# Patient Record
Sex: Male | Born: 1940 | Race: White | Hispanic: No | Smoking: Former smoker
Health system: Southern US, Community
[De-identification: ages and names within clinical notes are randomized; demographics above are authoritative.]

## PROBLEM LIST (undated history)

## (undated) DIAGNOSIS — C679 Malignant neoplasm of bladder, unspecified: Secondary | ICD-10-CM

## (undated) DIAGNOSIS — N189 Chronic kidney disease, unspecified: Secondary | ICD-10-CM

## (undated) DIAGNOSIS — F0390 Unspecified dementia without behavioral disturbance: Secondary | ICD-10-CM

## (undated) DIAGNOSIS — I10 Essential (primary) hypertension: Secondary | ICD-10-CM

## (undated) HISTORY — PX: BACK SURGERY: SHX140

## (undated) HISTORY — PX: CHOLECYSTECTOMY: SHX55

## (undated) HISTORY — PX: MINOR HEMORRHOIDECTOMY: SHX6238

---

## 2015-05-06 ENCOUNTER — Emergency Department (HOSPITAL_COMMUNITY): Payer: Medicare Other

## 2015-05-06 ENCOUNTER — Emergency Department (HOSPITAL_COMMUNITY)
Admission: EM | Admit: 2015-05-06 | Discharge: 2015-05-07 | Disposition: A | Discharge status: Other Acute Inpatient Hospital | Payer: Medicare Other | Attending: Emergency Medicine | Admitting: Emergency Medicine

## 2015-05-06 ENCOUNTER — Encounter (HOSPITAL_COMMUNITY): Payer: Self-pay | Admitting: Emergency Medicine

## 2015-05-06 DIAGNOSIS — Z79899 Other long term (current) drug therapy: Secondary | ICD-10-CM | POA: Diagnosis not present

## 2015-05-06 DIAGNOSIS — F03918 Unspecified dementia, unspecified severity, with other behavioral disturbance: Secondary | ICD-10-CM | POA: Diagnosis present

## 2015-05-06 DIAGNOSIS — Z8551 Personal history of malignant neoplasm of bladder: Secondary | ICD-10-CM | POA: Diagnosis not present

## 2015-05-06 DIAGNOSIS — F0391 Unspecified dementia with behavioral disturbance: Secondary | ICD-10-CM | POA: Diagnosis not present

## 2015-05-06 DIAGNOSIS — Z7951 Long term (current) use of inhaled steroids: Secondary | ICD-10-CM | POA: Diagnosis not present

## 2015-05-06 DIAGNOSIS — Z0000 Encounter for general adult medical examination without abnormal findings: Secondary | ICD-10-CM

## 2015-05-06 DIAGNOSIS — Z008 Encounter for other general examination: Secondary | ICD-10-CM | POA: Diagnosis present

## 2015-05-06 HISTORY — DX: Unspecified dementia, unspecified severity, without behavioral disturbance, psychotic disturbance, mood disturbance, and anxiety: F03.90

## 2015-05-06 HISTORY — DX: Malignant neoplasm of bladder, unspecified: C67.9

## 2015-05-06 LAB — CBC WITH DIFFERENTIAL/PLATELET
Basophils Absolute: 0 10*3/uL (ref 0.0–0.1)
Basophils Relative: 0 %
Eosinophils Absolute: 0.1 10*3/uL (ref 0.0–0.7)
Eosinophils Relative: 2 %
HEMATOCRIT: 40.6 % (ref 39.0–52.0)
HEMOGLOBIN: 13.1 g/dL (ref 13.0–17.0)
LYMPHS PCT: 21 %
Lymphs Abs: 1.2 10*3/uL (ref 0.7–4.0)
MCH: 30.9 pg (ref 26.0–34.0)
MCHC: 32.3 g/dL (ref 30.0–36.0)
MCV: 95.8 fL (ref 78.0–100.0)
MONO ABS: 0.7 10*3/uL (ref 0.1–1.0)
MONOS PCT: 11 %
NEUTROS ABS: 3.7 10*3/uL (ref 1.7–7.7)
NEUTROS PCT: 66 %
Platelets: 211 10*3/uL (ref 150–400)
RBC: 4.24 MIL/uL (ref 4.22–5.81)
RDW: 13.9 % (ref 11.5–15.5)
WBC: 5.7 10*3/uL (ref 4.0–10.5)

## 2015-05-06 LAB — URINALYSIS, ROUTINE W REFLEX MICROSCOPIC
BILIRUBIN URINE: NEGATIVE
GLUCOSE, UA: NEGATIVE mg/dL
Hgb urine dipstick: NEGATIVE
KETONES UR: NEGATIVE mg/dL
LEUKOCYTES UA: NEGATIVE
Nitrite: NEGATIVE
PH: 5.5 (ref 5.0–8.0)
Protein, ur: NEGATIVE mg/dL
SPECIFIC GRAVITY, URINE: 1.015 (ref 1.005–1.030)

## 2015-05-06 LAB — COMPREHENSIVE METABOLIC PANEL
ALBUMIN: 4.1 g/dL (ref 3.5–5.0)
ALK PHOS: 84 U/L (ref 38–126)
ALT: 18 U/L (ref 17–63)
ANION GAP: 8 (ref 5–15)
AST: 23 U/L (ref 15–41)
BUN: 16 mg/dL (ref 6–20)
CHLORIDE: 107 mmol/L (ref 101–111)
CO2: 26 mmol/L (ref 22–32)
Calcium: 9.4 mg/dL (ref 8.9–10.3)
Creatinine, Ser: 1.31 mg/dL — ABNORMAL HIGH (ref 0.61–1.24)
GFR calc Af Amer: >60 mL/min (ref >60)
GFR calc non Af Amer: 52 mL/min — ABNORMAL LOW (ref >60)
GLUCOSE: 121 mg/dL — AB (ref 65–99)
POTASSIUM: 4 mmol/L (ref 3.5–5.1)
SODIUM: 141 mmol/L (ref 135–145)
Total Bilirubin: 0.9 mg/dL (ref 0.3–1.2)
Total Protein: 7.4 g/dL (ref 6.5–8.1)

## 2015-05-06 LAB — ETHANOL: Alcohol, Ethyl (B): <5 mg/dL (ref <5)

## 2015-05-06 MED ORDER — NICOTINE 21 MG/24HR TD PT24: 21.0000 mg | MEDICATED_PATCH | Freq: Every day | TRANSDERMAL | Status: DC | PRN

## 2015-05-06 MED ORDER — ALUM & MAG HYDROXIDE-SIMETH 200-200-20 MG/5ML PO SUSP: 30.0000 mL | ORAL | Status: DC | PRN

## 2015-05-06 MED ORDER — ACETAMINOPHEN 325 MG PO TABS: 650.0000 mg | ORAL_TABLET | ORAL | Status: DC | PRN

## 2015-05-06 MED ORDER — ONDANSETRON HCL 4 MG PO TABS: 4.0000 mg | ORAL_TABLET | Freq: Three times a day (TID) | ORAL | Status: DC | PRN
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
## 2015-05-06 NOTE — ED Notes (Signed)
Male tech will try to draw labs
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
## 2015-05-06 NOTE — ED Notes (Signed)
Pt removed gown and put back on his clothing. Eulis Foster, MD at bedside for evaluation.
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
## 2015-05-07 ENCOUNTER — Emergency Department (HOSPITAL_COMMUNITY): Payer: Medicare Other

## 2015-05-07 ENCOUNTER — Other Ambulatory Visit: Payer: Self-pay

## 2015-05-07 DIAGNOSIS — F0391 Unspecified dementia with behavioral disturbance: Secondary | ICD-10-CM

## 2015-05-07 DIAGNOSIS — F03918 Unspecified dementia, unspecified severity, with other behavioral disturbance: Secondary | ICD-10-CM | POA: Diagnosis present

## 2015-05-07 LAB — RAPID URINE DRUG SCREEN, HOSP PERFORMED
AMPHETAMINES: NOT DETECTED
BARBITURATES: NOT DETECTED
Benzodiazepines: POSITIVE — AB
COCAINE: NOT DETECTED
Opiates: NOT DETECTED
TETRAHYDROCANNABINOL: NOT DETECTED

## 2015-05-07 MED ORDER — CITALOPRAM HYDROBROMIDE 10 MG PO TABS
10.0000 mg | ORAL_TABLET | Freq: Every day | ORAL | Status: DC
  Administered 2015-05-07: 10 mg via ORAL
  Filled 2015-05-07: qty 1

## 2015-05-07 MED ORDER — QUETIAPINE FUMARATE 50 MG PO TABS: 50.0000 mg | ORAL_TABLET | Freq: Every day | ORAL | Status: DC

## 2015-05-07 MED ORDER — DONEPEZIL HCL 5 MG PO TABS: 10.0000 mg | ORAL_TABLET | Freq: Every day | ORAL | Status: DC

## 2015-05-07 MED ORDER — HALOPERIDOL LACTATE 5 MG/ML IJ SOLN
5.0000 mg | Freq: Once | INTRAMUSCULAR | Status: AC
  Administered 2015-05-07: 5 mg via INTRAMUSCULAR
  Filled 2015-05-07: qty 1

## 2015-05-07 MED ORDER — BENAZEPRIL HCL 40 MG PO TABS
40.0000 mg | ORAL_TABLET | Freq: Every morning | ORAL | Status: DC
  Administered 2015-05-07: 40 mg via ORAL
  Filled 2015-05-07: qty 1

## 2015-05-07 MED ORDER — AMLODIPINE BESYLATE 5 MG PO TABS
5.0000 mg | ORAL_TABLET | ORAL | Status: DC
Start: 2015-05-08 — End: 2015-05-07

## 2015-05-07 MED ORDER — AMLODIPINE BESYLATE 5 MG PO TABS
5.0000 mg | ORAL_TABLET | Freq: Every morning | ORAL | Status: DC
Start: 2015-05-07 — End: 2015-05-07
  Administered 2015-05-07: 5 mg via ORAL
  Filled 2015-05-07: qty 1

## 2015-05-07 MED ORDER — FLUTICASONE PROPIONATE 50 MCG/ACT NA SUSP
1.0000 | Freq: Every morning | NASAL | Status: DC
  Administered 2015-05-07: 1 via NASAL
  Filled 2015-05-07: qty 16

## 2015-05-07 MED ORDER — HALOPERIDOL 1 MG PO TABS
1.0000 mg | ORAL_TABLET | Freq: Three times a day (TID) | ORAL | Status: DC
Start: 2015-05-07 — End: 2015-05-07
  Administered 2015-05-07: 1 mg via ORAL
  Filled 2015-05-07: qty 1

## 2015-05-07 MED ORDER — LISINOPRIL 2.5 MG PO TABS: 2.5000 mg | ORAL_TABLET | Freq: Every morning | ORAL | Status: DC

## 2015-05-07 MED ORDER — HALOPERIDOL 1 MG PO TABS: 0.5000 mg | ORAL_TABLET | Freq: Two times a day (BID) | ORAL | Status: DC

## 2015-05-07 MED ORDER — MEMANTINE HCL ER 7 MG PO CP24
7.0000 mg | ORAL_CAPSULE | Freq: Every morning | ORAL | Status: DC
  Administered 2015-05-07: 7 mg via ORAL
  Filled 2015-05-07: qty 1

## 2015-05-07 NOTE — ED Notes (Signed)
Patient belongings - khaki pants, brown belt, brown shoes, long sleeved shirt, silver metal watch.  All belongings labeled and placed in locker #29.

## 2015-05-07 NOTE — BHH Suicide Risk Assessment (Signed)
Suicide Risk Assessment  Discharge Assessment   [facility] Discharge Suicide Risk Assessment   Principal Problem: Dementia with behavioral disturbance Discharge Diagnoses:  Patient Active Problem List   Diagnosis Date Noted  . Dementia with behavioral disturbance [F03.91] 05/07/2015    Total Time spent with patient: 15 minutes  Musculoskeletal: Strength & Muscle Tone: unable to assess; patient evaluated in bed Gait & Station: unable to assess; patient evaluated in bed Patient leans: unable to assess; patient evaluated in bed  Psychiatric Specialty Exam: Blood pressure 150/97, pulse 60, temperature 97.5 F (36.4 C), temperature source Axillary, resp. rate 16, height 6\' 1"  (1.854 m), weight 95.255 kg (210 lb), SpO2 98 %.Body mass index is 27.71 kg/(m^2).  General Appearance: Casual  Eye Contact::  Fair  Speech:  unable to assess due to dementia  Volume:  Decreased  Mood:  unable to assess due to dementia  Affect:  Flat  Thought Process:  unable to assess due to dementia  Orientation:  Other:  unable to assess due to dementia  Thought Content:  unable to assess due to dementia  Suicidal Thoughts:  unable to assess due to dementia  Homicidal Thoughts:  unable to assess due to dementia  Memory:  unable to assess due to dementia  Judgement:  Other:  unable to assess due to dementia  Insight:  unable to assess due to dementia  Psychomotor Activity:  Decreased  Concentration:  unable to assess due to dementia  Recall:  unable to assess due to dementia  Fund of Knowledge:unable to assess due to dementia  Language: unable to assess due to dementia  Akathisia:  No  Handed:  Right  AIMS (if indicated):     Assets:  Housing Physical Health Social Support  ADL's:  Impaired  Cognition: Impaired,  Moderate  Sleep:       Mental Status Per Nursing Assessment::   On Admission:     Demographic Factors:  Male, Age 74 or older and Caucasian  Loss Factors: Decline in physical  health  Historical Factors: NA  Risk Reduction Factors:   Living with another person, especially a relative and Positive social support  Continued Clinical Symptoms:  Medical Diagnoses and Treatments/Surgeries  Cognitive Features That Contribute To Risk:  Loss of executive function    Suicide Risk:  Minimal: No identifiable suicidal ideation.  Patients presenting with no risk factors but with morbid ruminations; may be classified as minimal risk based on the severity of the depressive symptoms    Plan Of Care/Follow-up recommendations:  Activity:  As tolerated Diet:  Regular Tests:  As determined by PCP Other:     Serena Colonel, FNP-BC [place] 05/07/2015, 10:28 PM

## 2015-05-07 NOTE — Progress Notes (Signed)
Disposition CSW received telephone contact from [facility] staff requesting Chest X-Ray, Head CT, and EKG for possible admission.  CSW faxed Head CT to [facility] staff.  CSW contacted ED TTS to inform ED staff of requested testing.  [facility] Disposition CSW [phone]

## 2015-05-07 NOTE — Progress Notes (Signed)
This Probation officer spoke with Ren with [facility] [phone] 3x3 confirmed the request for transportation.  She is unable to provide a ETA only able to put patient on the list for transport.     Chesley Noon, MSW, Darlyn Read [facility] Triage Specialist [phone]

## 2015-05-07 NOTE — Consult Note (Signed)
[facility] Consult   Reason for Consult:  Psychiatric Evaluation Referring Physician:  EDP Patient Identification: Roberto Campbell MRN:  [id number] Principal Diagnosis: Dementia with behavioral disturbance Diagnosis:   Patient Active Problem List   Diagnosis Date Noted  . Dementia with behavioral disturbance [F03.91] 05/07/2015    Total Time spent with patient: 35 minutes  Subjective:   Roberto Campbell is a 75 y.o. male patient from [place].   HPI:  Roberto Campbell is a 75 yo Caucasian male who presented to [facility] ED for evaluation of sexually aggressive behavior per his nursing facility. He was seen recently at another hospital for the same behavior, he was evaluated for a urinary tract infection which was negative. He is seen today by Dr. Darleene Cleaver and Manus Gunning, NP. He is alert. No agitation or aggressive behaviors are observed. When asked a question he responds 'yea' only. When asked the year he responded "lent." His sitter at the bedside states he is cooperative but does grab at people but not overly aggressive at this point.   Past Psychiatric History: Dementia  Risk to Self: Suicidal Ideation: No Suicidal Intent: No Is patient at risk for suicide?: No Suicidal Plan?: No Access to Means: No What has been your use of drugs/alcohol within the last 12 months?: None  How many times?:  (Unable to assess ) Other Self Harm Risks:  (Unable to assess ) Triggers for Past Attempts: None known Intentional Self Injurious Behavior: None Risk to Others: Homicidal Ideation: No Thoughts of Harm to Others: No Current Homicidal Intent: No Current Homicidal Plan: No Access to Homicidal Means: No Identified Victim: N/A History of harm to others?:  (Unknown ) Assessment of Violence: None Noted Violent Behavior Description: No violent behaviors observed.  Does patient have access to weapons?: No Criminal Charges Pending?: No Does patient have a court date:  No Prior Inpatient Therapy: Prior Inpatient Therapy:  (Unable to assess) Prior Outpatient Therapy: Prior Outpatient Therapy: Yes Prior Therapy Dates: Current  Prior Therapy Facilty/Provider(s): [facility]  Reason for Treatment: Dementia  Does patient have an ACCT team?: No Does patient have Intensive In-House Services?  : No Does patient have [facility] services? : No Does patient have P4CC services?: No  Past Medical History:  Past Medical History  Diagnosis Date  . Dementia   . Bladder cancer ([place])    No past surgical history on file. Family History: No family history on file. Family Psychiatric  History: Unknown Social History:  History  Alcohol Use No     History  Drug Use Not on file    Social History   Social History  . Marital Status: Divorced    Spouse Name: N/A  . Number of Children: N/A  . Years of Education: N/A   Social History Main Topics  . Smoking status: None  . Smokeless tobacco: None  . Alcohol Use: No  . Drug Use: None  . Sexual Activity: Not Asked   Other Topics Concern  . None   Social History Narrative  . None   Additional Social History:    History of alcohol / drug use?:  (unable to assess )    Allergies:   Allergies  Allergen Reactions  . Morphine And Related Swelling    Other reaction(s): Hallucinations  . Alprazolam     Other reaction(s): Other (See Comments) Nightmares  . Atorvastatin Other (See Comments)    Myalgias.   . Meperidine     Other reaction(s): Other (  See Comments) Hallucinations    Labs:  Results for orders placed or performed during the hospital encounter of 05/06/15 (from the past 48 hour(s))  Comprehensive metabolic panel     Status: Abnormal   Collection Time: 05/06/15  8:19 PM  Result Value Ref Range   Sodium 141 135 - 145 mmol/L   Potassium 4.0 3.5 - 5.1 mmol/L   Chloride 107 101 - 111 mmol/L   CO2 26 22 - 32 mmol/L   Glucose, Bld 121 (H) 65 - 99 mg/dL   BUN 16 6 - 20 mg/dL    Creatinine, Ser 1.31 (H) 0.61 - 1.24 mg/dL   Calcium 9.4 8.9 - 10.3 mg/dL   Total Protein 7.4 6.5 - 8.1 g/dL   Albumin 4.1 3.5 - 5.0 g/dL   AST 23 15 - 41 U/L   ALT 18 17 - 63 U/L   Alkaline Phosphatase 84 38 - 126 U/L   Total Bilirubin 0.9 0.3 - 1.2 mg/dL   GFR calc non Af Amer 52 (L) >60 mL/min   GFR calc Af Amer >60 >60 mL/min    Comment: (NOTE) The eGFR has been calculated using the CKD EPI equation. This calculation has not been validated in all clinical situations. eGFR's persistently <60 mL/min signify possible Chronic Kidney Disease.    Anion gap 8 5 - 15  CBC with Differential     Status: None   Collection Time: 05/06/15  8:19 PM  Result Value Ref Range   WBC 5.7 4.0 - 10.5 K/uL   RBC 4.24 4.22 - 5.81 MIL/uL   Hemoglobin 13.1 13.0 - 17.0 g/dL   HCT 40.6 39.0 - 52.0 %   MCV 95.8 78.0 - 100.0 fL   MCH 30.9 26.0 - 34.0 pg   MCHC 32.3 30.0 - 36.0 g/dL   RDW 13.9 11.5 - 15.5 %   Platelets 211 150 - 400 K/uL   Neutrophils Relative % 66 %   Neutro Abs 3.7 1.7 - 7.7 K/uL   Lymphocytes Relative 21 %   Lymphs Abs 1.2 0.7 - 4.0 K/uL   Monocytes Relative 11 %   Monocytes Absolute 0.7 0.1 - 1.0 K/uL   Eosinophils Relative 2 %   Eosinophils Absolute 0.1 0.0 - 0.7 K/uL   Basophils Relative 0 %   Basophils Absolute 0.0 0.0 - 0.1 K/uL  Ethanol     Status: None   Collection Time: 05/06/15  8:19 PM  Result Value Ref Range   Alcohol, Ethyl (B) <5 <5 mg/dL    Comment:        LOWEST DETECTABLE LIMIT FOR SERUM ALCOHOL IS 5 mg/dL FOR MEDICAL PURPOSES ONLY   Urinalysis, Routine w reflex microscopic     Status: None   Collection Time: 05/06/15 11:23 PM  Result Value Ref Range   Color, Urine YELLOW YELLOW   APPearance CLEAR CLEAR   Specific Gravity, Urine 1.015 1.005 - 1.030   pH 5.5 5.0 - 8.0   Glucose, UA NEGATIVE NEGATIVE mg/dL   Hgb urine dipstick NEGATIVE NEGATIVE   Bilirubin Urine NEGATIVE NEGATIVE   Ketones, ur NEGATIVE NEGATIVE mg/dL   Protein, ur NEGATIVE NEGATIVE  mg/dL   Nitrite NEGATIVE NEGATIVE   Leukocytes, UA NEGATIVE NEGATIVE    Comment: MICROSCOPIC NOT DONE ON URINES WITH NEGATIVE PROTEIN, BLOOD, LEUKOCYTES, NITRITE, OR GLUCOSE <1000 mg/dL.  Urine rapid drug screen (hosp performed)     Status: Abnormal   Collection Time: 05/06/15 11:23 PM  Result Value Ref Range   Opiates  NONE DETECTED NONE DETECTED   Cocaine NONE DETECTED NONE DETECTED   Benzodiazepines POSITIVE (A) NONE DETECTED   Amphetamines NONE DETECTED NONE DETECTED   Tetrahydrocannabinol NONE DETECTED NONE DETECTED   Barbiturates NONE DETECTED NONE DETECTED    Comment:        DRUG SCREEN FOR MEDICAL PURPOSES ONLY.  IF CONFIRMATION IS NEEDED FOR ANY PURPOSE, NOTIFY LAB WITHIN 5 DAYS.        LOWEST DETECTABLE LIMITS FOR URINE DRUG SCREEN Drug Class       Cutoff (ng/mL) Amphetamine      1000 Barbiturate      200 Benzodiazepine   161 Tricyclics       096 Opiates          300 Cocaine          300 THC              50     Current Facility-Administered Medications  Medication Dose Route Frequency Provider Last Rate Last Dose  . acetaminophen (TYLENOL) tablet 650 mg  650 mg Oral Q4H PRN Daleen Bo, MD      . alum & mag hydroxide-simeth (MAALOX/MYLANTA) 200-200-20 MG/5ML suspension 30 mL  30 mL Oral PRN Daleen Bo, MD      . amLODipine (NORVASC) tablet 5 mg  5 mg Oral q morning - 10a Pattricia Boss, MD   5 mg at 05/07/15 1525  . benazepril (LOTENSIN) tablet 40 mg  40 mg Oral q morning - 10a Pattricia Boss, MD   40 mg at 05/07/15 1525  . citalopram (CELEXA) tablet 10 mg  10 mg Oral Daily Amee Boothe   10 mg at 05/07/15 1341  . donepezil (ARICEPT) tablet 10 mg  10 mg Oral QHS Pattricia Boss, MD      . fluticasone (FLONASE) 50 MCG/ACT nasal spray 1 spray  1 spray Each Nare q morning - 10a Pattricia Boss, MD   1 spray at 05/07/15 1526  . haloperidol (HALDOL) tablet 0.5 mg  0.5 mg Oral BID Wayne Wicklund      . memantine (NAMENDA XR) 24 hr capsule 7 mg  7 mg Oral q morning - 10a  Pattricia Boss, MD   7 mg at 05/07/15 1526  . nicotine (NICODERM CQ - dosed in mg/24 hours) patch 21 mg  21 mg Transdermal Daily PRN Daleen Bo, MD      . ondansetron [facility]) tablet 4 mg  4 mg Oral Q8H PRN Daleen Bo, MD      . QUEtiapine (SEROQUEL) tablet 50 mg  50 mg Oral QHS Pattricia Boss, MD       Current Outpatient Prescriptions  Medication Sig Dispense Refill  . acetaminophen (TYLENOL) 500 MG tablet Take 500 mg by mouth 2 (two) times daily.    Marland Kitchen amLODipine (NORVASC) 5 MG tablet Take 5 mg by mouth every morning.    Marland Kitchen atorvastatin (LIPITOR) 40 MG tablet Take 40 mg by mouth every evening.    . benazepril (LOTENSIN) 40 MG tablet Take 40 mg by mouth every morning.    . donepezil (ARICEPT) 10 MG tablet Take 10 mg by mouth at bedtime.    . ferrous sulfate 325 (65 FE) MG tablet Take 325 mg by mouth daily with breakfast.    . fluticasone (FLONASE) 50 MCG/ACT nasal spray Place 1 spray into both nostrils every morning.    Marland Kitchen guaiFENesin (ROBITUSSIN) 100 MG/5ML SOLN Take 10 mLs by mouth every 6 (six) hours as needed for cough or to loosen  phlegm.    Marland Kitchen lisinopril (PRINIVIL,ZESTRIL) 2.5 MG tablet Take 2.5 mg by mouth every morning.    Marland Kitchen LORazepam (ATIVAN) 0.5 MG tablet Take 0.5 mg by mouth every morning.    Marland Kitchen LORazepam (ATIVAN) 1 MG tablet Take 1 mg by mouth every 8 (eight) hours as needed for anxiety Marland Kitchen/agitation.).    Marland Kitchen memantine (NAMENDA XR) 7 MG CP24 24 hr capsule Take 7 mg by mouth every morning.    . mirabegron ER (MYRBETRIQ) 50 MG TB24 tablet Take 50 mg by mouth every morning.    . montelukast (SINGULAIR) 10 MG tablet Take 10 mg by mouth daily. 1600    . omeprazole (PRILOSEC) 20 MG capsule Take 20 mg by mouth daily.    Marland Kitchen PARoxetine (PAXIL) 10 MG tablet Take 10 mg by mouth every evening.    Marland Kitchen QUEtiapine (SEROQUEL) 50 MG tablet Take 50 mg by mouth at bedtime.    . Skin Protectants, Misc. (EUCERIN) cream Apply 1 application topically 2 (two) times daily. To face.    . tamsulosin (FLOMAX) 0.4  MG CAPS capsule Take 0.4 mg by mouth every evening.      Musculoskeletal: Strength & Muscle Tone: patient evaluated in bed Gait & Station: patient evaluated in bed Patient leans: patient evaluated in  bed  Psychiatric Specialty Exam: Review of Systems  Unable to perform ROS: dementia    Blood pressure 150/97, pulse 60, temperature 97.5 F (36.4 C), temperature source Axillary, resp. rate 16, height '6\' 1"'  (1.854 m), weight 95.255 kg (210 lb), SpO2 98 %.Body mass index is 27.71 kg/(m^2).  General Appearance: Casual  Eye Contact::  Fair  Speech:  unable to assess due to dementia  Volume:  Decreased  Mood:  unable to assess due to dementia  Affect:  Flat  Thought Process:  unable to assess due to dementia  Orientation:  Other:  unable to assess due to dementia  Thought Content:  unable to assess due to dementia  Suicidal Thoughts:  unable to assess due to dementia  Homicidal Thoughts:  unable to assess due to dementia  Memory:  unable to assess due to dementia  Judgement:  Other:  unable to assess due to dementia  Insight:  unable to assess due to dementia  Psychomotor Activity:  Decreased  Concentration:  unable to assess due to dementia  Recall:  unable to assess due to dementia  Fund of Knowledge:unable to assess due to dementia  Language: unable to assess due to dementia  Akathisia:  No  Handed:  Right  AIMS (if indicated):     Assets:  Housing Physical Health Social Support  ADL's:  Impaired  Cognition: Impaired,  Moderate  Sleep:      Treatment Plan Summary: Daily contact with patient to assess and evaluate symptoms and progress in treatment and Medication management  -Will continue current home medication regimen.  Disposition: Recommend psychiatric Inpatient admission when medically cleared.  Recommend geropsychiatric inpatient admission when medically cleared. Patient is currently being reviewed for admission at [facility].   Serena Colonel,  FNP-BC [place] 05/07/2015 10:07 PM Patient seen face-to-face for psychiatric evaluation, chart reviewed and case discussed with the physician extender and developed treatment plan. Reviewed the information documented and agree with the treatment plan. Corena Pilgrim, MD

## 2015-05-07 NOTE — ED Notes (Signed)
Bed: WA29 Expected date:  Expected time:  Means of arrival:  Comments: 

## 2015-05-07 NOTE — ED Notes (Addendum)
Pt has a Actuary. He is confused and is not sure of the year or where he is. Pt looks at the writer with a blank ,flat affect when spoken to . Presently he is up in a chair waiting for breakfast. Pt has to be prompted to eat and at times has to be fed. He was taken via stretcher for a CXR and tolerated well. Pt was moved to room 32. Sitter remains with the pt,. Phoned EDP to order pts BP meds. Also spoke to pharmacy as well (1:20pm)Pts son and daughter in law came to visit the pt.  4:30pm Pt keeps putting on the nurses call bell. He appears confused when questioned about this. Pt was instructed not to out paper towels in the commode. Pt s/p an EKG this am and also had a CXR in the dept. Phoned NP concerning pt becoming more and more and more agitated as the day progresses. The light is on in the pts room and NP will evaluate shortly. (5:15pm) per pts family they think he does suffer from sundowning. 6pm _pt has been accepted at [facility]. He will be transported via [facility].Phoned [facility] for transportation. (6pm)6:20pm Phoned Zella Richer at [facility]. [phone] and gave report. Sheliah Plane will pick the pt up and pt will go to room 413A at [facility].

## 2015-05-07 NOTE — Progress Notes (Signed)
This Probation officer spoke with Shirlee Limerick with [facility] accepting this patient per Dr. Geanie Kenning.  Shirlee Limerick spoke with the patient's son Harrell Gave and obtain a verbal consent for admission.  This Probation officer faxed over the [place] paper to [phone] per Grace's request.  This writer informed Marcie Bal, RN of the updated disposition and need for medical necessity for transport.  Call report to [place] at [phone].     Chesley Noon, MSW, Darlyn Read [facility] Triage Specialist [phone]

## 2015-07-12 ENCOUNTER — Emergency Department (HOSPITAL_COMMUNITY): Payer: Medicare Other

## 2015-07-12 ENCOUNTER — Encounter (HOSPITAL_COMMUNITY): Payer: Self-pay | Admitting: Internal Medicine

## 2015-07-12 ENCOUNTER — Inpatient Hospital Stay (HOSPITAL_COMMUNITY)
Admission: EM | Admit: 2015-07-12 | Discharge: 2015-07-20 | DRG: 871 | Disposition: A | Discharge status: Skilled Nursing Facility | Payer: Medicare Other | Attending: Internal Medicine | Admitting: Internal Medicine

## 2015-07-12 ENCOUNTER — Inpatient Hospital Stay (HOSPITAL_COMMUNITY): Payer: Medicare Other

## 2015-07-12 DIAGNOSIS — E872 Acidosis: Secondary | ICD-10-CM | POA: Diagnosis present

## 2015-07-12 DIAGNOSIS — I129 Hypertensive chronic kidney disease with stage 1 through stage 4 chronic kidney disease, or unspecified chronic kidney disease: Secondary | ICD-10-CM | POA: Diagnosis present

## 2015-07-12 DIAGNOSIS — N170 Acute kidney failure with tubular necrosis: Secondary | ICD-10-CM | POA: Diagnosis not present

## 2015-07-12 DIAGNOSIS — Y95 Nosocomial condition: Secondary | ICD-10-CM | POA: Diagnosis present

## 2015-07-12 DIAGNOSIS — L89159 Pressure ulcer of sacral region, unspecified stage: Secondary | ICD-10-CM | POA: Diagnosis present

## 2015-07-12 DIAGNOSIS — Z8551 Personal history of malignant neoplasm of bladder: Secondary | ICD-10-CM

## 2015-07-12 DIAGNOSIS — R7989 Other specified abnormal findings of blood chemistry: Secondary | ICD-10-CM | POA: Diagnosis present

## 2015-07-12 DIAGNOSIS — R9082 White matter disease, unspecified: Secondary | ICD-10-CM | POA: Diagnosis present

## 2015-07-12 DIAGNOSIS — A419 Sepsis, unspecified organism: Secondary | ICD-10-CM | POA: Diagnosis present

## 2015-07-12 DIAGNOSIS — R001 Bradycardia, unspecified: Secondary | ICD-10-CM | POA: Diagnosis present

## 2015-07-12 DIAGNOSIS — R52 Pain, unspecified: Secondary | ICD-10-CM

## 2015-07-12 DIAGNOSIS — R778 Other specified abnormalities of plasma proteins: Secondary | ICD-10-CM | POA: Diagnosis present

## 2015-07-12 DIAGNOSIS — Z66 Do not resuscitate: Secondary | ICD-10-CM | POA: Diagnosis present

## 2015-07-12 DIAGNOSIS — R4182 Altered mental status, unspecified: Secondary | ICD-10-CM | POA: Diagnosis present

## 2015-07-12 DIAGNOSIS — I4581 Long QT syndrome: Secondary | ICD-10-CM | POA: Diagnosis present

## 2015-07-12 DIAGNOSIS — N390 Urinary tract infection, site not specified: Secondary | ICD-10-CM | POA: Diagnosis present

## 2015-07-12 DIAGNOSIS — N179 Acute kidney failure, unspecified: Secondary | ICD-10-CM | POA: Diagnosis present

## 2015-07-12 DIAGNOSIS — E875 Hyperkalemia: Secondary | ICD-10-CM | POA: Diagnosis present

## 2015-07-12 DIAGNOSIS — F0391 Unspecified dementia with behavioral disturbance: Secondary | ICD-10-CM | POA: Diagnosis present

## 2015-07-12 DIAGNOSIS — F03918 Unspecified dementia, unspecified severity, with other behavioral disturbance: Secondary | ICD-10-CM | POA: Diagnosis present

## 2015-07-12 DIAGNOSIS — Z8249 Family history of ischemic heart disease and other diseases of the circulatory system: Secondary | ICD-10-CM | POA: Diagnosis not present

## 2015-07-12 DIAGNOSIS — Z79899 Other long term (current) drug therapy: Secondary | ICD-10-CM

## 2015-07-12 DIAGNOSIS — L8990 Pressure ulcer of unspecified site, unspecified stage: Secondary | ICD-10-CM | POA: Diagnosis present

## 2015-07-12 DIAGNOSIS — E878 Other disorders of electrolyte and fluid balance, not elsewhere classified: Secondary | ICD-10-CM | POA: Diagnosis present

## 2015-07-12 DIAGNOSIS — E860 Dehydration: Secondary | ICD-10-CM | POA: Diagnosis present

## 2015-07-12 DIAGNOSIS — I248 Other forms of acute ischemic heart disease: Secondary | ICD-10-CM | POA: Diagnosis present

## 2015-07-12 DIAGNOSIS — Z9049 Acquired absence of other specified parts of digestive tract: Secondary | ICD-10-CM

## 2015-07-12 DIAGNOSIS — Z87891 Personal history of nicotine dependence: Secondary | ICD-10-CM | POA: Diagnosis not present

## 2015-07-12 DIAGNOSIS — T17908A Unspecified foreign body in respiratory tract, part unspecified causing other injury, initial encounter: Secondary | ICD-10-CM

## 2015-07-12 DIAGNOSIS — G9341 Metabolic encephalopathy: Secondary | ICD-10-CM | POA: Diagnosis present

## 2015-07-12 DIAGNOSIS — G319 Degenerative disease of nervous system, unspecified: Secondary | ICD-10-CM

## 2015-07-12 DIAGNOSIS — N189 Chronic kidney disease, unspecified: Secondary | ICD-10-CM | POA: Diagnosis present

## 2015-07-12 DIAGNOSIS — E861 Hypovolemia: Secondary | ICD-10-CM | POA: Diagnosis present

## 2015-07-12 DIAGNOSIS — J189 Pneumonia, unspecified organism: Secondary | ICD-10-CM | POA: Diagnosis present

## 2015-07-12 DIAGNOSIS — N19 Unspecified kidney failure: Secondary | ICD-10-CM | POA: Diagnosis not present

## 2015-07-12 DIAGNOSIS — E870 Hyperosmolality and hypernatremia: Secondary | ICD-10-CM | POA: Diagnosis present

## 2015-07-12 DIAGNOSIS — E876 Hypokalemia: Secondary | ICD-10-CM | POA: Diagnosis present

## 2015-07-12 DIAGNOSIS — D696 Thrombocytopenia, unspecified: Secondary | ICD-10-CM | POA: Diagnosis present

## 2015-07-12 DIAGNOSIS — G9340 Encephalopathy, unspecified: Secondary | ICD-10-CM | POA: Diagnosis not present

## 2015-07-12 HISTORY — DX: Chronic kidney disease, unspecified: N18.9

## 2015-07-12 HISTORY — DX: Essential (primary) hypertension: I10

## 2015-07-12 LAB — COMPREHENSIVE METABOLIC PANEL
ALK PHOS: 95 U/L (ref 38–126)
ALT: 60 U/L (ref 17–63)
AST: 92 U/L — AB (ref 15–41)
Albumin: 2.9 g/dL — ABNORMAL LOW (ref 3.5–5.0)
Anion gap: 19 — ABNORMAL HIGH (ref 5–15)
BILIRUBIN TOTAL: 0.5 mg/dL (ref 0.3–1.2)
BUN: 133 mg/dL — AB (ref 6–20)
CHLORIDE: 115 mmol/L — AB (ref 101–111)
CO2: 22 mmol/L (ref 22–32)
Calcium: 9.2 mg/dL (ref 8.9–10.3)
Creatinine, Ser: 8.5 mg/dL — ABNORMAL HIGH (ref 0.61–1.24)
GFR calc Af Amer: 6 mL/min — ABNORMAL LOW (ref >60)
GFR calc non Af Amer: 5 mL/min — ABNORMAL LOW (ref >60)
GLUCOSE: 142 mg/dL — AB (ref 65–99)
POTASSIUM: 4.4 mmol/L (ref 3.5–5.1)
Sodium: 156 mmol/L — ABNORMAL HIGH (ref 135–145)
Total Protein: 7 g/dL (ref 6.5–8.1)

## 2015-07-12 LAB — CBC
HCT: 42.3 % (ref 39.0–52.0)
Hemoglobin: 13.7 g/dL (ref 13.0–17.0)
MCH: 30.4 pg (ref 26.0–34.0)
MCHC: 32.4 g/dL (ref 30.0–36.0)
MCV: 94 fL (ref 78.0–100.0)
PLATELETS: 134 10*3/uL — AB (ref 150–400)
RBC: 4.5 MIL/uL (ref 4.22–5.81)
RDW: 14 % (ref 11.5–15.5)
WBC: 15.4 10*3/uL — ABNORMAL HIGH (ref 4.0–10.5)

## 2015-07-12 LAB — OCCULT BLOOD X 1 CARD TO LAB, STOOL: Fecal Occult Bld: NEGATIVE

## 2015-07-12 LAB — PROCALCITONIN: Procalcitonin: 1.97 ng/mL

## 2015-07-12 LAB — PROTIME-INR
INR: 1.57 — AB (ref 0.00–1.49)
PROTHROMBIN TIME: 18.8 s — AB (ref 11.6–15.2)

## 2015-07-12 LAB — URINALYSIS, ROUTINE W REFLEX MICROSCOPIC
Glucose, UA: NEGATIVE mg/dL
Hgb urine dipstick: NEGATIVE
KETONES UR: NEGATIVE mg/dL
NITRITE: POSITIVE — AB
PROTEIN: NEGATIVE mg/dL
SPECIFIC GRAVITY, URINE: 1.024 (ref 1.005–1.030)
pH: 5 (ref 5.0–8.0)

## 2015-07-12 LAB — LIPASE, BLOOD: LIPASE: 35 U/L (ref 11–51)

## 2015-07-12 LAB — TROPONIN I
TROPONIN I: 0.11 ng/mL — AB (ref <0.031)
Troponin I: 0.08 ng/mL — ABNORMAL HIGH (ref <0.031)
Troponin I: 0.06 ng/mL — ABNORMAL HIGH (ref <0.031)

## 2015-07-12 LAB — MRSA PCR SCREENING: MRSA BY PCR: NEGATIVE

## 2015-07-12 LAB — I-STAT CG4 LACTIC ACID, ED: Lactic Acid, Venous: 2.14 mmol/L (ref 0.5–2.0)

## 2015-07-12 LAB — URINE MICROSCOPIC-ADD ON

## 2015-07-12 LAB — LACTIC ACID, PLASMA
LACTIC ACID, VENOUS: 1.5 mmol/L (ref 0.5–2.0)
LACTIC ACID, VENOUS: 1.1 mmol/L (ref 0.5–2.0)

## 2015-07-12 LAB — CBG MONITORING, ED: Glucose-Capillary: 97 mg/dL (ref 65–99)

## 2015-07-12 LAB — APTT: APTT: 35 s (ref 24–37)

## 2015-07-12 MED ORDER — SODIUM CHLORIDE 0.9 % IV BOLUS (SEPSIS)
1000.0000 mL | INTRAVENOUS | Status: AC
  Administered 2015-07-12 (×3): 1000 mL via INTRAVENOUS

## 2015-07-12 MED ORDER — ACETAMINOPHEN 650 MG RE SUPP
650.0000 mg | Freq: Four times a day (QID) | RECTAL | Status: DC | PRN
  Administered 2015-07-12 – 2015-07-13 (×3): 650 mg via RECTAL
  Filled 2015-07-12 (×3): qty 1

## 2015-07-12 MED ORDER — PIPERACILLIN-TAZOBACTAM 3.375 G IVPB 30 MIN: 3.3750 g | Freq: Once | INTRAVENOUS | Status: DC

## 2015-07-12 MED ORDER — VANCOMYCIN HCL IN DEXTROSE 1-5 GM/200ML-% IV SOLN
1000.0000 mg | INTRAVENOUS | Status: AC
  Administered 2015-07-12: 1000 mg via INTRAVENOUS
  Filled 2015-07-12: qty 200

## 2015-07-12 MED ORDER — SODIUM CHLORIDE 0.9 % IV SOLN
INTRAVENOUS | Status: DC
  Administered 2015-07-12 – 2015-07-13 (×2): via INTRAVENOUS

## 2015-07-12 MED ORDER — ONDANSETRON HCL 4 MG/2ML IJ SOLN: 4.0000 mg | Freq: Four times a day (QID) | INTRAMUSCULAR | Status: DC | PRN

## 2015-07-12 MED ORDER — SODIUM CHLORIDE 0.9 % IV SOLN: INTRAVENOUS | Status: DC

## 2015-07-12 MED ORDER — BISACODYL 10 MG RE SUPP: 10.0000 mg | Freq: Every day | RECTAL | Status: DC | PRN

## 2015-07-12 MED ORDER — PIPERACILLIN-TAZOBACTAM 3.375 G IVPB 30 MIN
3.3750 g | Freq: Once | INTRAVENOUS | Status: AC
  Administered 2015-07-12: 3.375 g via INTRAVENOUS
  Filled 2015-07-12: qty 50

## 2015-07-12 MED ORDER — VANCOMYCIN HCL IN DEXTROSE 1-5 GM/200ML-% IV SOLN: 1000.0000 mg | INTRAVENOUS | Status: DC

## 2015-07-12 MED ORDER — LIP MEDEX EX OINT
TOPICAL_OINTMENT | CUTANEOUS | Status: AC
  Filled 2015-07-12: qty 7

## 2015-07-12 MED ORDER — ACETAMINOPHEN 325 MG PO TABS
650.0000 mg | ORAL_TABLET | Freq: Four times a day (QID) | ORAL | Status: DC | PRN
  Administered 2015-07-17 – 2015-07-18 (×2): 650 mg via ORAL
  Filled 2015-07-12 (×2): qty 2

## 2015-07-12 MED ORDER — PIPERACILLIN-TAZOBACTAM IN DEX 2-0.25 GM/50ML IV SOLN: 2.2500 g | Freq: Three times a day (TID) | INTRAVENOUS | Status: DC

## 2015-07-12 MED ORDER — VANCOMYCIN HCL IN DEXTROSE 1-5 GM/200ML-% IV SOLN
1000.0000 mg | Freq: Once | INTRAVENOUS | Status: AC
  Administered 2015-07-12: 1000 mg via INTRAVENOUS
  Filled 2015-07-12: qty 200

## 2015-07-12 MED ORDER — PIPERACILLIN-TAZOBACTAM IN DEX 2-0.25 GM/50ML IV SOLN
2.2500 g | Freq: Three times a day (TID) | INTRAVENOUS | Status: DC
  Administered 2015-07-12 – 2015-07-14 (×6): 2.25 g via INTRAVENOUS
  Filled 2015-07-12 (×6): qty 50

## 2015-07-12 MED ORDER — ONDANSETRON HCL 4 MG PO TABS: 4.0000 mg | ORAL_TABLET | Freq: Four times a day (QID) | ORAL | Status: DC | PRN

## 2015-07-12 MED ORDER — HEPARIN SODIUM (PORCINE) 5000 UNIT/ML IJ SOLN
5000.0000 [IU] | Freq: Three times a day (TID) | INTRAMUSCULAR | Status: DC
  Administered 2015-07-12 – 2015-07-20 (×25): 5000 [IU] via SUBCUTANEOUS
  Filled 2015-07-12 (×29): qty 1

## 2015-07-12 MED ORDER — VANCOMYCIN HCL IN DEXTROSE 1-5 GM/200ML-% IV SOLN: 1000.0000 mg | Freq: Once | INTRAVENOUS | Status: DC

## 2015-07-12 NOTE — Progress Notes (Signed)
Pharmacy Antibiotic Note  Roberto Campbell is a 75 y.o. male admitted on 07/12/2015 with sepsis.  He resides at a nursing facility and was brought to ED with altered mental status.  Pharmacy has been consulted for Vancomycin & Zosyn dosing.  07/12/2015:  Afebrile  Lactic acid elevated (2.14)  Leukocytosis (15.4)  Hypotensive  Acute renal injury- Scr elevated at 8.5 with estimated CrCl <4ml/min.  (baseline ~ 1.3 in Jan 2017).  *Noted patient has not been eating/drinking over past couple weeks.    Already received Vancomycin 1gm and Zosyn 3.375gm in ED  Plan:  Zosyn 2.25gm IV q8h for CrCl<21ml/min  Vancomycin 1000mg  IV x1 now for total loading dose = 2000mg .    Random Vancomycin level in ~48hrs Monitor renal function and cx data   Height: 6' (182.9 cm) Weight: 210 lb (95.255 kg) IBW/kg (Calculated) : 77.6  Temp (24hrs), Avg:97.5 F (36.4 C), Min:97.5 F (36.4 C), Max:97.5 F (36.4 C)   Recent Labs Lab 07/12/15 1005 07/12/15 1009 07/12/15 1019  WBC  --  15.4*  --   CREATININE 8.50*  --   --   LATICACIDVEN  --   --  2.14*    Estimated Creatinine Clearance: 9.3 mL/min (by C-G formula based on Cr of 8.5).    Allergies  Allergen Reactions  . Morphine And Related Swelling    Other reaction(s): Hallucinations  . Alprazolam     Other reaction(s): Other (See Comments) Nightmares  . Atorvastatin Other (See Comments)    Myalgias.   . Meperidine     Other reaction(s): Other (See Comments) Hallucinations    Antimicrobials this admission: Vanc 3/28 >>  Zosyn 3/28 >>   Dose adjustments this admission: 3/30 @ 1000: RVL = ________  Microbiology results: 3/28 BCx: sent 3/28 UCx: sent   Thank you for allowing pharmacy to be a part of this patient's care.  Netta Cedars, PharmD, BCPS Pager: [phone] 07/12/2015 11:12 AM

## 2015-07-12 NOTE — ED Provider Notes (Signed)
CSN: [id number]     Arrival date & time 07/12/15  [id number] History   First MD Initiated Contact with Patient 07/12/15 [phone]     Chief Complaint  Patient presents with  . Altered Mental Status  Level V caveat: Altered mental status HPI Patient presents to the emergency room with complaints of altered mental status. The patient is a resident of a nursing facility. According to the EMS report the staff told EMS that over the last couple of weeks he has had a gradual decline. he has not been eating or drinking well. At baseline he has a history of dementia.  The patient will not answer any questions when asked and I am unable to get any additional history from him. Past Medical History  Diagnosis Date  . Dementia   . Bladder cancer ([place])    No past surgical history on file. No family history on file. Social History  Substance Use Topics  . Smoking status: Not on file  . Smokeless tobacco: Not on file  . Alcohol Use: No    Review of Systems  All other systems reviewed and are negative.     Allergies  Morphine and related; Alprazolam; Atorvastatin; and Meperidine  Home Medications   Prior to Admission medications   Medication Sig Start Date End Date Taking? Authorizing Provider  amLODipine (NORVASC) 10 MG tablet Take 10 mg by mouth daily.   Yes Historical Provider, MD  carbamazepine (TEGRETOL) 100 MG chewable tablet Chew 100 mg by mouth 3 (three) times daily.   Yes Historical Provider, MD  donepezil (ARICEPT) 10 MG tablet Take 10 mg by mouth at bedtime.   Yes Historical Provider, MD  escitalopram (LEXAPRO) 20 MG tablet Take 20 mg by mouth daily.   Yes Historical Provider, MD  ferrous sulfate 325 (65 FE) MG tablet Take 325 mg by mouth 2 (two) times daily with a meal.    Yes Historical Provider, MD  hydrOXYzine (ATARAX/VISTARIL) 50 MG tablet Take 50 mg by mouth 2 (two) times daily.   Yes Historical Provider, MD  lisinopril (PRINIVIL,ZESTRIL) 5 MG tablet Take 5 mg by mouth daily.   Yes  Historical Provider, MD  memantine (NAMENDA) 10 MG tablet Take 10 mg by mouth 2 (two) times daily.   Yes Historical Provider, MD  mirabegron ER (MYRBETRIQ) 50 MG TB24 tablet Take 50 mg by mouth every morning.   Yes Historical Provider, MD  montelukast (SINGULAIR) 10 MG tablet Take 10 mg by mouth daily with breakfast.    Yes Historical Provider, MD  Nutritional Supplements (NUTRITIONAL SHAKE) LIQD Take 118 mLs by mouth 3 (three) times daily with meals. *Mighty Shakes*   Yes Historical Provider, MD  omeprazole (PRILOSEC) 20 MG capsule Take 20 mg by mouth daily.   Yes Historical Provider, MD  polyethylene glycol (MIRALAX / GLYCOLAX) packet Take 17 g by mouth daily.   Yes Historical Provider, MD  selenium sulfide (SELSUN) 1 % LOTN Apply 1 application topically 2 (two) times a week. Apply 3ml to affected area of scalp and beard for 2 to 3 minutes and rinse thoroughly twice weekly   Yes Historical Provider, MD  Skin Protectants, Misc. (EUCERIN) cream Apply 1 application topically 2 (two) times daily. To face.   Yes Historical Provider, MD  tamsulosin (FLOMAX) 0.4 MG CAPS capsule Take 0.4 mg by mouth daily after breakfast.    Yes Historical Provider, MD  traZODone (DESYREL) 100 MG tablet Take 100 mg by mouth at bedtime.   Yes Historical Provider,  MD  ziprasidone (GEODON) 20 MG capsule Take 20 mg by mouth 2 (two) times daily with a meal.   Yes Historical Provider, MD   BP 103/57 mmHg  Pulse 54  Temp(Src) 97.5 F (36.4 C) (Axillary)  Resp 15  Ht 6' (1.829 m)  Wt 95.255 kg  BMI 28.47 kg/m2  SpO2 93% Physical Exam  Constitutional: He appears lethargic. He has a sickly appearance. No distress.  Frail, elderly  HENT:  Head: Normocephalic and atraumatic.  Right Ear: External ear normal.  Left Ear: External ear normal.  Mouth/Throat: No oropharyngeal exudate.  Mucous membranes are dry, poor oral hygiene  Eyes: Conjunctivae are normal. Right eye exhibits no discharge. Left eye exhibits no discharge.  No scleral icterus.  Neck: Neck supple. No tracheal deviation present.  Cardiovascular: Normal rate, regular rhythm and intact distal pulses.   Pulmonary/Chest: Effort normal and breath sounds normal. No stridor. No respiratory distress. He has no wheezes. He has no rales.  Abdominal: Soft. Bowel sounds are normal. He exhibits no distension. There is tenderness in the epigastric area. There is no rebound and no guarding. No hernia.  The patient grimaces with palpation of the upper abdomen  Genitourinary:  Patient is incontinent of stool, dark black stool noted in the diaper  Musculoskeletal: He exhibits no edema or tenderness.  Neurological: He appears lethargic. No cranial nerve deficit (no facial droop,   ) or sensory deficit. He exhibits normal muscle tone. He displays no seizure activity.  Patient does not answer any questions, eyes remained closed, does grimace and withdraw to painful stimuli  Skin: Skin is warm and dry. No rash noted.  No skin breakdown noted, no decubitus ulcers in the heels or sacrum  Psychiatric: He has a normal mood and affect.  Nursing note and vitals reviewed.   ED Course  Procedures (including critical care time) CRITICAL CARE Performed by: [id number] Total critical care time: 35 minutes Critical care time was exclusive of separately billable procedures and treating other patients. Critical care was necessary to treat or prevent imminent or life-threatening deterioration. Critical care was time spent personally by me on the following activities: development of treatment plan with patient and/or surrogate as well as nursing, discussions with consultants, evaluation of patient's response to treatment, examination of patient, obtaining history from patient or surrogate, ordering and performing treatments and interventions, ordering and review of laboratory studies, ordering and review of radiographic studies, pulse oximetry and re-evaluation of patient's  condition.  Labs Review Labs Reviewed  COMPREHENSIVE METABOLIC PANEL - Abnormal; Notable for the following:    Sodium 156 (*)    Chloride 115 (*)    Glucose, Bld 142 (*)    BUN 133 (*)    Creatinine, Ser 8.50 (*)    Albumin 2.9 (*)    AST 92 (*)    GFR calc non Af Amer 5 (*)    GFR calc Af Amer 6 (*)    Anion gap 19 (*)    All other components within normal limits  URINALYSIS, ROUTINE W REFLEX MICROSCOPIC (NOT AT [facility]) - Abnormal; Notable for the following:    Color, Urine RED (*)    APPearance CLOUDY (*)    Bilirubin Urine MODERATE (*)    Nitrite POSITIVE (*)    Leukocytes, UA SMALL (*)    All other components within normal limits  TROPONIN I - Abnormal; Notable for the following:    Troponin I 0.11 (*)    All other components within normal limits  CBC - Abnormal; Notable for the following:    WBC 15.4 (*)    Platelets 134 (*)    All other components within normal limits  URINE MICROSCOPIC-ADD ON - Abnormal; Notable for the following:    Squamous Epithelial / LPF 0-5 (*)    Bacteria, UA MANY (*)    All other components within normal limits  I-STAT CG4 LACTIC ACID, ED - Abnormal; Notable for the following:    Lactic Acid, Venous 2.14 (*)    All other components within normal limits  CULTURE, BLOOD (ROUTINE X 2)  CULTURE, BLOOD (ROUTINE X 2)  URINE CULTURE  LIPASE, BLOOD  CBG MONITORING, ED  CBG MONITORING, ED  POC OCCULT BLOOD, ED    Imaging Review Ct Head Wo Contrast  07/12/2015  CLINICAL DATA:  Dementia, bladder cancer, altered mental status EXAM: CT HEAD WITHOUT CONTRAST TECHNIQUE: Contiguous axial images were obtained from the base of the skull through the vertex without intravenous contrast. COMPARISON:  05/06/2015 FINDINGS: Brain: No intracranial hemorrhage, mass effect or midline shift. No acute cortical infarction. No mass lesion is noted on this unenhanced scan. Moderate cerebral atrophy again noted. Stable chronic white matter disease. Vascular:  Atherosclerotic calcifications of carotid siphon. Skull: Negative for fracture or focal lesion. Sinuses/Orbits: No acute findings. Other: None. IMPRESSION: No acute intracranial abnormality. Moderate cerebral atrophy again noted. Stable periventricular and patchy subcortical chronic white matter disease. No definite acute cortical infarction. Ventricular size is stable from prior exam. Electronically Signed   By: Lahoma Crocker M.D.   On: 07/12/2015 10:46   I have personally reviewed and evaluated these images and lab results as part of my medical decision-making.   EKG Interpretation   Date/Time:  Tuesday July 12 2015 09:39:49 EDT Ventricular Rate:  68 PR Interval:  174 QRS Duration: 109 QT Interval:  473 QTC Calculation: 503 R Axis:   84 Text Interpretation:  Sinus rhythm Atrial premature complex Borderline  right axis deviation Abnormal R-wave progression, early transition  Prolonged QT interval , increased qt interval since last tracing Confirmed  by Dorianne Perret  MD-J, Dashley Monts [id number]) on 07/12/2015 9:49:17 AM      MDM   Final diagnoses:  Acute renal failure, unspecified acute renal failure type [facility])   Patient presents to the emergency room with altered mental status. The patient is uremic and in acute renal failure which is the cause of his mental status changes. I suspect this may be related to dehydration but the patient will require further evaluation to determine the cause of his renal failure.  Bladder does not appear distended on exam but we will do a Foley catheter to assess for bladder outlet obstruction.  Patient has been given IV fluids. His blood pressure is improving from the 123XX123 systolic to the low 123XX123 now. He does have a slight increase in his troponin but I think this is most likely related to the renal failure and not acute cardiac ischemia. Need to be monitored  Patient was started on empiric antibiotics for possible sepsis although at this point I do not see any evidence of  infection and I do think his hypotension is related to dehydration and renal failure  Findings and plan were discussed with the patient's son. I spoke with Dr. Rockne Menghini who will admit the patient to the hospital for further treatment     Dorie Rank, MD 07/12/15 1159

## 2015-07-12 NOTE — ED Notes (Addendum)
Per EMS, patient is coming in due to alerted mental status. Nursing facility states he has had a decline over the past couple of the weeks; patient has not been eating/drinking. Hx of dementia. Patient is from [place]

## 2015-07-12 NOTE — H&P (Addendum)
History and Physical:    Roberto Campbell   [id number] DOB: 06/24/1940 DOA: 07/12/2015  Referring MD/provider: Dr. Dorie Rank PCP: No primary care provider on file.   Chief Complaint: Altered mental status  History of Present Illness:   Roberto Campbell is an 75 y.o. male with a PMH of bladder cancer and dementia, sent from his nursing facility with a 2 week history of gradual decline in his usual condition. According to nursing home staff, he's not been eating or drinking well. He has not been getting out of bed. Patient is currently nonverbal and unable to provide any additional history. Upon initial evaluation in the ED, the patient was found to have marked elevation of his creatinine and hypotension, concerning for sepsis of a clear etiology. Blood pressure did respond to IV fluid challenge. Chest radiography was not done. While I examine him, the patient is noted to have a congested cough. According to the patient's son, he has had a steady decline over the past 3 months.  He was mobile up until 3 days ago.  Doesn't always recognize family members.    ROS:   Review of Systems  Unable to perform ROS: dementia   Past Medical History:   Past Medical History  Diagnosis Date  . Dementia   . Bladder cancer ([place])   . HTN (hypertension)   . CKD (chronic kidney disease)     Past Surgical History:   Past Surgical History  Procedure Laterality Date  . Back surgery    . Minor hemorrhoidectomy    . Cholecystectomy      Social History:   Social History   Social History  . Marital Status: Divorced    Spouse Name: N/A  . Number of Children: 5  . Years of Education: N/A   Occupational History  . Not on file.   Social History Main Topics  . Smoking status: Former Research scientist (life sciences)  . Smokeless tobacco: Not on file  . Alcohol Use: No  . Drug Use: Not on file  . Sexual Activity: Not on file   Other Topics Concern  . Not on file   Social History Narrative   Divorced.  Resident of  [facility]  Ambulates independently at baseline.      Family history:   Family History  Problem Relation Age of Onset  . Heart failure Father   . Heart failure Brother   . Cancer Father     Prostate    Allergies   Morphine and related; Alprazolam; Atorvastatin; and Meperidine  Current Medications:   Prior to Admission medications   Medication Sig Start Date End Date Taking? Authorizing Provider  amLODipine (NORVASC) 10 MG tablet Take 10 mg by mouth daily.   Yes Historical Provider, MD  carbamazepine (TEGRETOL) 100 MG chewable tablet Chew 100 mg by mouth 3 (three) times daily.   Yes Historical Provider, MD  donepezil (ARICEPT) 10 MG tablet Take 10 mg by mouth at bedtime.   Yes Historical Provider, MD  escitalopram (LEXAPRO) 20 MG tablet Take 20 mg by mouth daily.   Yes Historical Provider, MD  ferrous sulfate 325 (65 FE) MG tablet Take 325 mg by mouth 2 (two) times daily with a meal.    Yes Historical Provider, MD  hydrOXYzine (ATARAX/VISTARIL) 50 MG tablet Take 50 mg by mouth 2 (two) times daily.   Yes Historical Provider, MD  lisinopril (PRINIVIL,ZESTRIL) 5 MG tablet Take 5 mg by mouth daily.   Yes Historical Provider,  MD  memantine (NAMENDA) 10 MG tablet Take 10 mg by mouth 2 (two) times daily.   Yes Historical Provider, MD  mirabegron ER (MYRBETRIQ) 50 MG TB24 tablet Take 50 mg by mouth every morning.   Yes Historical Provider, MD  montelukast (SINGULAIR) 10 MG tablet Take 10 mg by mouth daily with breakfast.    Yes Historical Provider, MD  Nutritional Supplements (NUTRITIONAL SHAKE) LIQD Take 118 mLs by mouth 3 (three) times daily with meals. *Mighty Shakes*   Yes Historical Provider, MD  omeprazole (PRILOSEC) 20 MG capsule Take 20 mg by mouth daily.   Yes Historical Provider, MD  polyethylene glycol (MIRALAX / GLYCOLAX) packet Take 17 g by mouth daily.   Yes Historical Provider, MD  selenium sulfide (SELSUN) 1 % LOTN Apply 1 application topically 2  (two) times a week. Apply 12ml to affected area of scalp and beard for 2 to 3 minutes and rinse thoroughly twice weekly   Yes Historical Provider, MD  Skin Protectants, Misc. (EUCERIN) cream Apply 1 application topically 2 (two) times daily. To face.   Yes Historical Provider, MD  tamsulosin (FLOMAX) 0.4 MG CAPS capsule Take 0.4 mg by mouth daily after breakfast.    Yes Historical Provider, MD  traZODone (DESYREL) 100 MG tablet Take 100 mg by mouth at bedtime.   Yes Historical Provider, MD  ziprasidone (GEODON) 20 MG capsule Take 20 mg by mouth 2 (two) times daily with a meal.   Yes Historical Provider, MD    Physical Exam:   Filed Vitals:   07/12/15 1200 07/12/15 1210 07/12/15 1215 07/12/15 1520  BP: 108/91 108/91  85/58  Pulse: 50 60 63 88  Temp:    97.6 F (36.4 C)  TempSrc:    Axillary  Resp: 15 23 15 17   Height:      Weight:      SpO2:  98% 95%      Physical Exam: Blood pressure 85/58, pulse 88, temperature 97.6 F (36.4 C), temperature source Axillary, resp. rate 17, height 6' (1.829 m), weight 95.255 kg (210 lb), SpO2 95 %. Gen: No acute distress. Somnolent. Head: Normocephalic, atraumatic. Eyes: Unable to assess, the patient will not voluntarily open his eyes and squeezes them shut when I attempt to open them.. Mouth: Oropharynx reveals poor dentition with extremely dry mucous membranes and dry cracking lips. Neck: Supple, no thyromegaly, no lymphadenopathy, no jugular venous distention. Chest: Lungs are diminished throughout with a congested cough/upper airway congestion. CV: Heart sounds are mildly bradycardic. No murmurs, rubs, or gallops. Abdomen: Soft, nontender, nondistended with normal active bowel sounds. Extremities: Extremities are without clubbing, edema, or cyanosis. Skin: Warm and dry. 10 x 6.5 sacral injury. Neuro: Unable to assess, the patient is somnolent. Psych: Unable to assess.  Data Review:    Labs: Basic Metabolic Panel:  Recent Labs Lab  07/12/15 1005  NA 156*  K 4.4  CL 115*  CO2 22  GLUCOSE 142*  BUN 133*  CREATININE 8.50*  CALCIUM 9.2   Liver Function Tests:  Recent Labs Lab 07/12/15 1005  AST 92*  ALT 60  ALKPHOS 95  BILITOT 0.5  PROT 7.0  ALBUMIN 2.9*    Recent Labs Lab 07/12/15 1006  LIPASE 35   CBC:  Recent Labs Lab 07/12/15 1009  WBC 15.4*  HGB 13.7  HCT 42.3  MCV 94.0  PLT 134*   Cardiac Enzymes:  Recent Labs Lab 07/12/15 1006 07/12/15 1605  TROPONINI 0.11* 0.08*   CBG:  Recent Labs  Lab 07/12/15 0922  GLUCAP 97    Radiographic Studies: Ct Head Wo Contrast  07/12/2015  CLINICAL DATA:  Dementia, bladder cancer, altered mental status EXAM: CT HEAD WITHOUT CONTRAST TECHNIQUE: Contiguous axial images were obtained from the base of the skull through the vertex without intravenous contrast. COMPARISON:  05/06/2015 FINDINGS: Brain: No intracranial hemorrhage, mass effect or midline shift. No acute cortical infarction. No mass lesion is noted on this unenhanced scan. Moderate cerebral atrophy again noted. Stable chronic white matter disease. Vascular: Atherosclerotic calcifications of carotid siphon. Skull: Negative for fracture or focal lesion. Sinuses/Orbits: No acute findings. Other: None. IMPRESSION: No acute intracranial abnormality. Moderate cerebral atrophy again noted. Stable periventricular and patchy subcortical chronic white matter disease. No definite acute cortical infarction. Ventricular size is stable from prior exam. Electronically Signed   By: Lahoma Crocker M.D.   On: 07/12/2015 10:46   Dg Chest Port 1 View  07/12/2015  CLINICAL DATA:  Sepsis EXAM: PORTABLE CHEST 1 VIEW COMPARISON:  05/07/2015 FINDINGS: Cardiac shadow is within normal limits. Elevation of left hemidiaphragm is noted and stable. Some mild increased density is noted in the right upper lobe which may represent early infiltrate. No bony abnormality is seen. IMPRESSION: Likely early infiltrate in the right upper  lobe. Electronically Signed   By: Inez Catalina M.D.   On: 07/12/2015 14:35    EKG: Independently reviewed. Sinus rhythm with poor R-wave progression and prolonged QTc interval. No obvious ischemic changes.   Assessment/Plan:   Principal Problem:   Sepsis ([place]) secondary to UTI versus HCAP - Sepsis order set utilized.  - Meets 2 or more SIRS criteria (Tem > 100.9 <96.8; HR >90; RR >20; PaCO2<32; WBC >12K<4K or >10 %bands AND has evidence of acute organ failure (lactic acidosis, oliguria, ALI, ARDS, coagulopathy/DIC, AMS, hypotension/shock) - This patient is at high risk of poor outcomes with a SOFA score of 3. - Source thought to be from UTI given positive nitrites and many bacteria on urinalysis. Check chest x-ray. - Send blood and urine cultures. - WBC is 15.4, lactic acid is 2.14.  Check pro-calcitoninin. - Fluid volume resuscitate with 30 mg/kg using weight based algorithm per sepsis order set. - Start antibiotics with vancomycin and Zosyn, narrow as appropriate.  Active Problems:   Deep tissue injury/pressure sore - Foam dressings per nursing staff.    Prolonged QTc interval - Hold Geodon. 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    ARF (acute renal failure) (HCC) with hypernatremia and dehydration - Likely prerenal and related to dehydration given his hypernatremia. - Place Foley catheter. If the patient does not respond to IV fluids, check renal ultrasound.    Thrombocytopenia ([place]) - Likely related to acute infection.    Elevated troponin - Likely demand ischemia in the setting of sepsis. - The patient is a DO NOT RESUSCITATE. Cycle troponins. - We'll give rectal aspirin. - Patient is not likely a candidate for aggressive intervention, and would not work this up further.    DVT prophylaxis - Heparin ordered.  Code Status /  Family Communication / Disposition Plan:   Code Status: DO NOT RESUSCITATE Family Communication: No family currently at the bedside. Son, Ardell Hegge, updated by telephone.  Disposition Plan: Likely will be able to return to his memory care unit and 3-4 days once source of sepsis treated, renal function improves and the patient remains hemodynamically  stable..  Time spent: 70 minutes.  RAMA,CHRISTINA [facility] Pager [phone] Cell: [phone]   If 7PM-7AM, please contact night-coverage [email or web address] Password [facility] 07/12/2015, 5:12 PM

## 2015-07-12 NOTE — ED Notes (Signed)
Hospitalist at bedside 

## 2015-07-12 NOTE — ED Notes (Signed)
Patient transported to CT 

## 2015-07-12 NOTE — ED Notes (Signed)
113 mL in bladder with bladder scan.

## 2015-07-13 ENCOUNTER — Encounter (HOSPITAL_COMMUNITY): Payer: Self-pay | Admitting: *Deleted

## 2015-07-13 DIAGNOSIS — L8990 Pressure ulcer of unspecified site, unspecified stage: Secondary | ICD-10-CM

## 2015-07-13 LAB — CBC
HCT: 35.3 % — ABNORMAL LOW (ref 39.0–52.0)
Hemoglobin: 11.3 g/dL — ABNORMAL LOW (ref 13.0–17.0)
MCH: 30.3 pg (ref 26.0–34.0)
MCHC: 32 g/dL (ref 30.0–36.0)
MCV: 94.6 fL (ref 78.0–100.0)
PLATELETS: 96 10*3/uL — AB (ref 150–400)
RBC: 3.73 MIL/uL — AB (ref 4.22–5.81)
RDW: 14 % (ref 11.5–15.5)
WBC: 7.7 10*3/uL (ref 4.0–10.5)

## 2015-07-13 LAB — URINE CULTURE: CULTURE: NO GROWTH

## 2015-07-13 LAB — BASIC METABOLIC PANEL
Anion gap: 14 (ref 5–15)
BUN: 119 mg/dL — AB (ref 6–20)
CHLORIDE: 127 mmol/L — AB (ref 101–111)
CO2: 17 mmol/L — AB (ref 22–32)
CREATININE: 6.22 mg/dL — AB (ref 0.61–1.24)
Calcium: 7.8 mg/dL — ABNORMAL LOW (ref 8.9–10.3)
GFR calc non Af Amer: 8 mL/min — ABNORMAL LOW (ref >60)
GFR, EST AFRICAN AMERICAN: 9 mL/min — AB (ref >60)
Glucose, Bld: 119 mg/dL — ABNORMAL HIGH (ref 65–99)
Potassium: 3.7 mmol/L (ref 3.5–5.1)
SODIUM: 158 mmol/L — AB (ref 135–145)

## 2015-07-13 LAB — STREP PNEUMONIAE URINARY ANTIGEN: Strep Pneumo Urinary Antigen: NEGATIVE

## 2015-07-13 LAB — TROPONIN I: TROPONIN I: 0.06 ng/mL — AB (ref <0.031)

## 2015-07-13 MED ORDER — SODIUM CHLORIDE 0.45 % IV SOLN
INTRAVENOUS | Status: DC
  Administered 2015-07-13: 14:00:00 via INTRAVENOUS

## 2015-07-13 MED ORDER — ENSURE ENLIVE PO LIQD: 237.0000 mL | Freq: Two times a day (BID) | ORAL | Status: DC

## 2015-07-13 MED ORDER — FLUTICASONE PROPIONATE 50 MCG/ACT NA SUSP
2.0000 | Freq: Every day | NASAL | Status: DC
  Administered 2015-07-13 – 2015-07-20 (×7): 2 via NASAL
  Filled 2015-07-13: qty 16

## 2015-07-13 MED ORDER — CHLORHEXIDINE GLUCONATE 0.12 % MT SOLN
15.0000 mL | Freq: Two times a day (BID) | OROMUCOSAL | Status: DC
  Administered 2015-07-14 – 2015-07-20 (×11): 15 mL via OROMUCOSAL
  Filled 2015-07-13 (×17): qty 15

## 2015-07-13 MED ORDER — CETYLPYRIDINIUM CHLORIDE 0.05 % MT LIQD
7.0000 mL | Freq: Two times a day (BID) | OROMUCOSAL | Status: DC
  Administered 2015-07-14 – 2015-07-20 (×13): 7 mL via OROMUCOSAL

## 2015-07-13 NOTE — Progress Notes (Signed)
[facility]                                                                              Patient Demographics  Roberto Campbell, is a 75 y.o. male, DOB - 07/18/1940, [id number]  Admit date - 07/12/2015   Admitting Physician Venetia Maxon Rama, MD  Outpatient Primary MD for the patient is No primary care provider on file.  LOS - 1   Chief Complaint  Patient presents with  . Altered Mental Status      Interim history  76 year old male with history bladder cancer and dementia, Who [place] in a [place], presented with two-week history of gradual decline in his condition. Patient was having poor oral intake and not able to get out of bed. In the emergency department, patient found to have elevated creatinine and hypotension concerning for sepsis however no clear etiology. Creatinine mildly improving. Suspect discharge back to SNF within 2-3 days.  Assessment & Plan  Sepsis ([place]) secondary to UTI versus HCAP -Sepsis order set utilized.  -Upon admission, patient was tachypneic with leukocytosis, lactic acidosis and hypotension -UA showed many bacteria, and the PBC 0-5, positive nitrites, small leukocytes -Urine culture pending -Chest x-ray: Likely early infiltrate in the right upper lobe -Blood cultures currently show no growth to date -Strep pneumonia urine antigen pending. -Continue IV fluids, empiric vancomycin and Zosyn per pharmacy  Acute kidney failure with hypernatremia/ hyperchloremia -Likely secondary to dehydration.  -Creatinine 1.3 on 05/06/2015 -Creatinine improving. Was 8.5 upon admission, currently 6.22 -Will change IV fluids to half-normal saline, continue to monitor BMP  Deep tissue injury/pressure sore -Foam dressings per nursing staff.  Prolonged QTc interval -Hold Geodon. 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  Thrombocytopenia  -Likely related to acute infection. -Continue to monitor CBC. No evidence of bleeding  Elevated troponin -Likely demand ischemia in the setting of sepsis. -peak troponin 0.08, down to 0.06 (flat) -Continue aspirin -Patient is not likely a candidate for aggressive intervention, and would not work this up further.  Code Status: DNR  Family Communication: None at bedside. Son via phone.  Disposition Plan: Admitted. Pending improvement in Cr.   Time Spent in minutes   30 minutes  Procedures  None  Consults   None  DVT Prophylaxis  Heparin  Lab Results  Component Value Date   PLT 96* 07/13/2015    Medications  Scheduled Meds: . sodium chloride   Intravenous STAT  . antiseptic oral rinse  7 mL Mouth Rinse q12n4p  . chlorhexidine  15 mL Mouth Rinse BID  . fluticasone  2 spray Each Nare Daily  . heparin  5,000 Units Subcutaneous 3 times per day  . piperacillin-tazobactam (ZOSYN)  IV  2.25 g Intravenous Q8H   Continuous Infusions: . sodium chloride 150 mL/hr at 07/13/15 0618   PRN Meds:.acetaminophen **OR** acetaminophen, bisacodyl, ondansetron **OR** ondansetron (ZOFRAN) IV  Antibiotics    Anti-infectives    Start     Dose/Rate Route Frequency Ordered Stop   07/12/15 1800  piperacillin-tazobactam (ZOSYN) IVPB 2.25 g  Status:  Discontinued  2.25 g 100 mL/hr over 30 Minutes Intravenous Every 8 hours 07/12/15 1141 07/12/15 1155   07/12/15 1800  piperacillin-tazobactam (ZOSYN) IVPB 2.25 g     2.25 g 100 mL/hr over 30 Minutes Intravenous Every 8 hours 07/12/15 1315     07/12/15 1415  piperacillin-tazobactam (ZOSYN) IVPB 3.375 g  Status:  Discontinued     3.375 g 100 mL/hr over 30 Minutes Intravenous  Once 07/12/15 1405 07/12/15 1425   07/12/15 1415  vancomycin (VANCOCIN) IVPB 1000 mg/200 mL premix  Status:  Discontinued     1,000 mg 200 mL/hr over 60 Minutes Intravenous  Once 07/12/15 1405 07/12/15 1425   07/12/15 1330  vancomycin  (VANCOCIN) IVPB 1000 mg/200 mL premix     1,000 mg 200 mL/hr over 60 Minutes Intravenous NOW 07/12/15 1315 07/12/15 1616   07/12/15 1145  vancomycin (VANCOCIN) IVPB 1000 mg/200 mL premix  Status:  Discontinued     1,000 mg 200 mL/hr over 60 Minutes Intravenous NOW 07/12/15 1141 07/12/15 1155   07/12/15 0945  piperacillin-tazobactam (ZOSYN) IVPB 3.375 g     3.375 g 100 mL/hr over 30 Minutes Intravenous  Once 07/12/15 0934 07/12/15 1040   07/12/15 0945  vancomycin (VANCOCIN) IVPB 1000 mg/200 mL premix     1,000 mg 200 mL/hr over 60 Minutes Intravenous  Once 07/12/15 0934 07/12/15 1155     Subjective:   Roberto Campbell seen and examined today.  Patient has dementia.  Not very talkative this morning.    Objective:   Filed Vitals:   07/12/15 1215 07/12/15 1520 07/12/15 2149 07/13/15 0609  BP:  85/58 98/43 90/63   Pulse: 63 88 62 76  Temp:  97.6 F (36.4 C) 97.8 F (36.6 C) 98.2 F (36.8 C)  TempSrc:  Axillary Axillary Axillary  Resp: 15 17 16 18   Height:      Weight:      SpO2: 95%  96% 96%    Wt Readings from Last 3 Encounters:  07/12/15 95.255 kg (210 lb)  05/06/15 95.255 kg (210 lb)     Intake/Output Summary (Last 24 hours) at 07/13/15 1229 Last data filed at 07/13/15 0747  Gross per 24 hour  Intake      0 ml  Output   1700 ml  Net  -1700 ml    Exam  General: Well developed, somnolent, NAD  HEENT: NCAT, mucous membranes dry  Cardiovascular: S1 S2 auscultated, no murmurs, RRR  Respiratory: Diminished breath sounds  Abdomen: Soft, nontender, nondistended, + bowel sounds  Extremities: warm dry without cyanosis clubbing or edema  Neuro: Unable to assess at this time.  Psych: Unable to assess   Data Review   Micro Results Recent Results (from the past 240 hour(s))  Blood Culture (routine x 2)     Status: None (Preliminary result)   Collection Time: 07/12/15 10:00 AM  Result Value Ref Range Status   Specimen Description BLOOD RIGHT ANTECUBITAL  Final    Special Requests BOTTLES DRAWN AEROBIC AND ANAEROBIC 5ML  Final   Culture   Final    NO GROWTH < 24 HOURS Performed at [facility]    Report Status PENDING  Incomplete  Blood Culture (routine x 2)     Status: None (Preliminary result)   Collection Time: 07/12/15 10:06 AM  Result Value Ref Range Status   Specimen Description BLOOD RIGHT HAND  Final   Special Requests BOTTLES DRAWN AEROBIC ONLY 5ML  Final   Culture   Final    NO  GROWTH < 24 HOURS Performed at [facility]    Report Status PENDING  Incomplete  Urine culture     Status: None   Collection Time: 07/12/15 10:51 AM  Result Value Ref Range Status   Specimen Description URINE, CATHETERIZED  Final   Special Requests NONE  Final   Culture   Final    NO GROWTH 1 DAY Performed at [facility]    Report Status 07/13/2015 FINAL  Final  MRSA PCR Screening     Status: None   Collection Time: 07/12/15  8:05 PM  Result Value Ref Range Status   MRSA by PCR NEGATIVE NEGATIVE Final    Comment:        The GeneXpert MRSA Assay (FDA approved for NASAL specimens only), is one component of a comprehensive MRSA colonization surveillance program. It is not intended to diagnose MRSA infection nor to guide or monitor treatment for MRSA infections.     Radiology Reports Ct Head Wo Contrast  07/12/2015  CLINICAL DATA:  Dementia, bladder cancer, altered mental status EXAM: CT HEAD WITHOUT CONTRAST TECHNIQUE: Contiguous axial images were obtained from the base of the skull through the vertex without intravenous contrast. COMPARISON:  05/06/2015 FINDINGS: Brain: No intracranial hemorrhage, mass effect or midline shift. No acute cortical infarction. No mass lesion is noted on this unenhanced scan. Moderate cerebral atrophy again noted. Stable chronic white matter disease. Vascular: Atherosclerotic calcifications of carotid siphon. Skull: Negative for fracture or focal lesion. Sinuses/Orbits: No acute findings. Other:  None. IMPRESSION: No acute intracranial abnormality. Moderate cerebral atrophy again noted. Stable periventricular and patchy subcortical chronic white matter disease. No definite acute cortical infarction. Ventricular size is stable from prior exam. Electronically Signed   By: Lahoma Crocker M.D.   On: 07/12/2015 10:46   Dg Chest Port 1 View  07/12/2015  CLINICAL DATA:  Sepsis EXAM: PORTABLE CHEST 1 VIEW COMPARISON:  05/07/2015 FINDINGS: Cardiac shadow is within normal limits. Elevation of left hemidiaphragm is noted and stable. Some mild increased density is noted in the right upper lobe which may represent early infiltrate. No bony abnormality is seen. IMPRESSION: Likely early infiltrate in the right upper lobe. Electronically Signed   By: Inez Catalina M.D.   On: 07/12/2015 14:35    CBC  Recent Labs Lab 07/12/15 1009 07/13/15 0144  WBC 15.4* 7.7  HGB 13.7 11.3*  HCT 42.3 35.3*  PLT 134* 96*  MCV 94.0 94.6  MCH 30.4 30.3  MCHC 32.4 32.0  RDW 14.0 14.0    Chemistries   Recent Labs Lab 07/12/15 1005 07/13/15 0144  NA 156* 158*  K 4.4 3.7  CL 115* 127*  CO2 22 17*  GLUCOSE 142* 119*  BUN 133* 119*  CREATININE 8.50* 6.22*  CALCIUM 9.2 7.8*  AST 92*  --   ALT 60  --   ALKPHOS 95  --   BILITOT 0.5  --    ------------------------------------------------------------------------------------------------------------------ estimated creatinine clearance is 12.7 mL/min (by C-G formula based on Cr of 6.22). ------------------------------------------------------------------------------------------------------------------ No results for input(s): HGBA1C in the last 72 hours. ------------------------------------------------------------------------------------------------------------------ No results for input(s): CHOL, HDL, LDLCALC, TRIG, CHOLHDL, LDLDIRECT in the last 72  hours. ------------------------------------------------------------------------------------------------------------------ No results for input(s): TSH, T4TOTAL, T3FREE, THYROIDAB in the last 72 hours.  Invalid input(s): FREET3 ------------------------------------------------------------------------------------------------------------------ No results for input(s): VITAMINB12, FOLATE, FERRITIN, TIBC, IRON, RETICCTPCT in the last 72 hours.  Coagulation profile  Recent Labs Lab 07/12/15 1605  INR 1.57*    No results for input(s): DDIMER  in the last 72 hours.  Cardiac Enzymes  Recent Labs Lab 07/12/15 1605 07/12/15 2202 07/13/15 0144  TROPONINI 0.08* 0.06* 0.06*   ------------------------------------------------------------------------------------------------------------------ Invalid input(s): POCBNP    Addi Pak D.O. on 07/13/2015 at 12:29 PM  Between 7am to 7pm - Pager - [phone]  After 7pm go to [email or web address] - password [email or web address]  And look for the night coverage person covering for me after hours  [facility] Office  [phone]

## 2015-07-13 NOTE — Progress Notes (Signed)
Initial Nutrition Assessment  DOCUMENTATION CODES:   Not applicable  INTERVENTION:  -Ensure Enlive po BID, each supplement provides 350 kcal and 20 grams of protein  NUTRITION DIAGNOSIS:   Inadequate oral intake related to lethargy/confusion as evidenced by per patient/family report.  GOAL:   Patient will meet greater than or equal to 90% of their needs  MONITOR:   PO intake, Supplement acceptance, Labs, Skin, I & O's  REASON FOR ASSESSMENT:   Malnutrition Screening Tool    ASSESSMENT:   Roberto Campbell is an 75 y.o. male with a PMH of bladder cancer and dementia, sent from his nursing facility with a 2 week history of gradual decline in his usual condition.  Spoke with Pt's son at bedside. Pt is confused, agitated. Son states as far he knows, prior to 2 week decline, pt was eating well. During that time, pt ate poorly. No weight loss that son knows of. Pt's wt is currently stable per chart. PO intake 0% in chart for dinner last night and breakfast this morning.    He is a full feeding assist.  No issues with chewing/swallowing at this point in time. No nausea/vomiting.  Nutrition-Focused physical exam completed. Findings are no fat depletion, no muscle depletion, and no edema.   Continue to follow for poor PO intake, initiation of nutrition support/goals of care. He is currently a DNR.  Labs and Medications reviewed.  Diet Order:  Diet NPO time specified  Skin:  Wound (see comment)  Last BM:  3/28  Height:   Ht Readings from Last 1 Encounters:  07/12/15 6' (1.829 m)    Weight:   Wt Readings from Last 1 Encounters:  07/12/15 210 lb (95.255 kg)    Ideal Body Weight:  80.91 kg  BMI:  Body mass index is 28.47 kg/(m^2).  Estimated Nutritional Needs:   Kcal:  1900-2400  Protein:  95-115 grams  Fluid:  >/= 1.9L  EDUCATION NEEDS:   No education needs identified at this time  Satira Anis. Kyden Potash, MS, RD LDN After Hours/Weekend Pager [phone]

## 2015-07-13 NOTE — Clinical Social Work Note (Signed)
Clinical Social Work Assessment  Patient Details  Name: Rohen Kimes MRN: [id number] Date of Birth: 03-Aug-1940  Date of referral:  07/13/15               Reason for consult:                   Permission sought to share information with:  Facility Art therapist granted to share information::  Yes, Verbal Permission Granted  Name::        Agency::     Relationship::     Contact Information:     Housing/Transportation Living arrangements for the past 2 months:   (memory care unit) Source of Information:  Adult Children, Facility Patient Interpreter Needed:  None Criminal Activity/Legal Involvement Pertinent to Current Situation/Hospitalization:  No - Comment as needed Significant Relationships:  Adult Children Lives with:  Facility Resident Do you feel safe going back to the place where you live?  Yes Need for family participation in patient care:  Yes (Comment)  Care giving concerns:  No concerns reported at this time.   Social Worker assessment / plan:  Pt hospitalized on 07/12/15 from [facility] memory care unit with Sepsis. CSW social work met with pt's son to assist with d/c planning. Son reports that pt was at [facility] unit originally. Due to agitation pt was sent to [facility] psych for approximately 1 month. Prior to geri psych son reports that pt was ambulatory and communicative. Pt was d/c to [facility] from [place] about 3 wks ago. Son reports that pt hasn't spoken to anyone for approximately 3 wks. Prior to [facility] admission pt was ambulating well. Pt had a " shuffling gait at [facility] and just prior to this admission pt was unable to get out of bed independently. Son reports that pt did speak to him today for this first time in weeks. Pt's son would like pt to return to [facility] at d/c. Clinicals have been sent to [facility] for review. CSW will continue to follow to assist with d/c planning. Employment  status:  Retired Nurse, adult PT Recommendations:  Not assessed at this time Information / Referral to community resources:     Patient/Family's Response to care:  Son would like pt to return to [facility].  Patient/Family's Understanding of and Emotional Response to Diagnosis, Current Treatment, and Prognosis: Pt's son is aware of pt's medical status. He has been very pleased with the care pt has received at [facility]. " My dad has always been very active. At [facility] there is space for him to walk around all day long. "  Emotional Assessment Appearance:  Appears stated age Attitude/Demeanor/Rapport:   (cooperative) Affect (typically observed):  Restless Orientation:  Oriented to Self Alcohol / Substance use:  Not Applicable Psych involvement (Current and /or in the community):  No (Comment)  Discharge Needs  Concerns to be addressed:  Discharge Planning Concerns Readmission within the last 30 days:  No Current discharge risk:  None Barriers to Discharge:  No Barriers Identified   Loraine Maple  [phone] 07/13/2015, 12:19 PM

## 2015-07-14 DIAGNOSIS — G9340 Encephalopathy, unspecified: Secondary | ICD-10-CM

## 2015-07-14 LAB — CBC
HCT: 37.7 % — ABNORMAL LOW (ref 39.0–52.0)
Hemoglobin: 12.3 g/dL — ABNORMAL LOW (ref 13.0–17.0)
MCH: 30.7 pg (ref 26.0–34.0)
MCHC: 32.6 g/dL (ref 30.0–36.0)
MCV: 94 fL (ref 78.0–100.0)
PLATELETS: 117 10*3/uL — AB (ref 150–400)
RBC: 4.01 MIL/uL — AB (ref 4.22–5.81)
RDW: 14.1 % (ref 11.5–15.5)
WBC: 8.2 10*3/uL (ref 4.0–10.5)

## 2015-07-14 LAB — VANCOMYCIN, RANDOM: VANCOMYCIN RM: 13 ug/mL

## 2015-07-14 LAB — BASIC METABOLIC PANEL
ANION GAP: 9 (ref 5–15)
BUN: 89 mg/dL — AB (ref 6–20)
BUN: 79 mg/dL — ABNORMAL HIGH (ref 6–20)
CALCIUM: 8.3 mg/dL — AB (ref 8.9–10.3)
CO2: 21 mmol/L — ABNORMAL LOW (ref 22–32)
CO2: 19 mmol/L — AB (ref 22–32)
CREATININE: 4.04 mg/dL — AB (ref 0.61–1.24)
Calcium: 8.6 mg/dL — ABNORMAL LOW (ref 8.9–10.3)
Chloride: >130 mmol/L (ref 101–111)
Chloride: 129 mmol/L — ABNORMAL HIGH (ref 101–111)
Creatinine, Ser: 3.04 mg/dL — ABNORMAL HIGH (ref 0.61–1.24)
GFR calc Af Amer: 16 mL/min — ABNORMAL LOW (ref >60)
GFR, EST AFRICAN AMERICAN: 22 mL/min — AB (ref >60)
GFR, EST NON AFRICAN AMERICAN: 13 mL/min — AB (ref >60)
GFR, EST NON AFRICAN AMERICAN: 19 mL/min — AB (ref >60)
Glucose, Bld: 102 mg/dL — ABNORMAL HIGH (ref 65–99)
Glucose, Bld: 143 mg/dL — ABNORMAL HIGH (ref 65–99)
POTASSIUM: 4.2 mmol/L (ref 3.5–5.1)
Potassium: 3.4 mmol/L — ABNORMAL LOW (ref 3.5–5.1)
Sodium: 161 mmol/L (ref 135–145)
Sodium: 157 mmol/L — ABNORMAL HIGH (ref 135–145)

## 2015-07-14 MED ORDER — ASPIRIN 300 MG RE SUPP
300.0000 mg | Freq: Every day | RECTAL | Status: DC
  Administered 2015-07-14 – 2015-07-17 (×4): 300 mg via RECTAL
  Filled 2015-07-14 (×4): qty 1

## 2015-07-14 MED ORDER — SODIUM CHLORIDE 0.9 % IV SOLN
INTRAVENOUS | Status: AC
  Administered 2015-07-14: 15:00:00 via INTRAVENOUS

## 2015-07-14 MED ORDER — PIPERACILLIN-TAZOBACTAM 3.375 G IVPB
3.3750 g | Freq: Three times a day (TID) | INTRAVENOUS | Status: DC
Start: 2015-07-14 — End: 2015-07-17
  Administered 2015-07-14 – 2015-07-17 (×9): 3.375 g via INTRAVENOUS
  Filled 2015-07-14 (×10): qty 50

## 2015-07-14 MED ORDER — VANCOMYCIN HCL IN DEXTROSE 1-5 GM/200ML-% IV SOLN
1000.0000 mg | Freq: Once | INTRAVENOUS | Status: AC
  Administered 2015-07-14: 1000 mg via INTRAVENOUS
  Filled 2015-07-14: qty 200

## 2015-07-14 MED ORDER — DEXTROSE 5 % IV SOLN
INTRAVENOUS | Status: DC
  Administered 2015-07-14 (×2): via INTRAVENOUS

## 2015-07-14 MED ORDER — POTASSIUM CHLORIDE 10 MEQ/100ML IV SOLN
10.0000 meq | INTRAVENOUS | Status: AC
Start: 2015-07-14 — End: 2015-07-14
  Administered 2015-07-14 (×3): 10 meq via INTRAVENOUS
  Filled 2015-07-14 (×3): qty 100

## 2015-07-14 NOTE — Progress Notes (Signed)
Pharmacy Antibiotic Note  Sayeed Lipe is a 75 y.o. male admitted on 07/12/2015 with sepsis.  He resides at a nursing facility and was brought to ED with altered mental status.  Pharmacy has been consulted for Vancomycin & Zosyn dosing.  Sources potentially UTI vs HCAP.  CXR = Likely early infiltrate in the right upper lobe.  Severe AKI- improving with hydration. (baseline SCr ~ 1.3 in Jan 2017). Dosing vancomycin based on levels due to severe AKI.  Vancomycin random level obtained this AM = 13 mcg/ml (~48h from 1st dose, pt received staggered 2g loading dose)  Plan:  Vancomycin 1000mg  IV x1 now Adjust Zosyn to 3.375g IV q8h (4 hour infusion time) since CrCl now >20 ml/min and improving. Check another random Vancomycin level in ~48hrs (4/1 AM) if continued or possibly sooner if SCr dramatically improves tomorrow but would consider discontinuing vancomycin if cultures remain negative at 48 hours.  Note MRSA PCR negative.  Height: 6' (182.9 cm) Weight: 210 lb (95.255 kg) IBW/kg (Calculated) : 77.6  Temp (24hrs), Avg:97.6 F (36.4 C), Min:97.4 F (36.3 C), Max:97.9 F (36.6 C)   Recent Labs Lab 07/12/15 1005 07/12/15 1009 07/12/15 1019 07/12/15 1606 07/12/15 1921 07/13/15 0144 07/14/15 0530 07/14/15 1007  WBC  --  15.4*  --   --   --  7.7 8.2  --   CREATININE 8.50*  --   --   --   --  6.22* 4.04*  --   LATICACIDVEN  --   --  2.14* 1.5 1.1  --   --   --   VANCORANDOM  --   --   --   --   --   --   --  13    Estimated Creatinine Clearance: 19.5 mL/min (by C-G formula based on Cr of 4.04).    Allergies  Allergen Reactions  . Morphine And Related Swelling    Other reaction(s): Hallucinations  . Alprazolam     Other reaction(s): Other (See Comments) Nightmares  . Atorvastatin Other (See Comments)    Myalgias.   . Meperidine     Other reaction(s): Other (See Comments) Hallucinations    Antimicrobials this admission: vanc 3/28 >>  Zosyn 3/28 >>   Dose adjustments this  admission: 3/30 @ 1000: RVL = 13 mcg/ml (~48h from 1st dose, pt received staggered 2g loading dose); Give 1g x 1 now; Increase Zosyn to extended interval dosing of 3.375g IV q8h (4 hour infusion time) since CrCl now >20 ml/min.  Microbiology results: 3/28 BCx: ngtd < 24h 3/28 UCx: NGF 3/28 MRSA PCR neg 3/29 strept pneumo ur ag: neg  Thank you for allowing pharmacy to be a part of this patient's care.  Hershal Coria, PharmD, BCPS Pager: [phone] 07/14/2015 11:33 AM

## 2015-07-14 NOTE — Progress Notes (Addendum)
[facility]                                                                              Patient Demographics  Roberto Campbell, is a 75 y.o. male, DOB - 15-Jun-1940, [id number]  Admit date - 07/12/2015   Admitting Physician Venetia Maxon Rama, MD  Outpatient Primary MD for the patient is No primary care provider on file.  LOS - 2   Chief Complaint  Patient presents with  . Altered Mental Status      Interim history  75 year old male with a PMH of dementia, bladder cancer, HTN, chronic kidney disease, a resident of SNF, sent to the ED on 07/12/15 with two-week history of gradual decline in his usual condition, not been eating or drinking well or getting out of bed. In the ED he was nonverbal and unable to provide any history. Initial evaluation showed markedly elevated creatinine and hypotension concerning for sepsis etiology but blood pressure responded to IV fluid challenge. Admitted for sepsis, acute kidney injury and hypernatremia. As per RN discussion with son today, patient was ambulating independently PTA but since November 2016, he has been communicating less.  Assessment & Plan  Sepsis ([place]) secondary to Possible HCAP -Sepsis order set utilized.  -Upon admission, patient was tachypneic with leukocytosis, lactic acidosis and hypotension -UA showed many bacteria, and the PBC 0-5, positive nitrites, small leukocytes -Urine culture Negative -Chest x-ray: Likely early infiltrate in the right upper lobe -Blood cultures currently show no growth to date -Strep pneumonia urine antigen negative - Treated empirically with IV vancomycin and Zosyn. DC vancomycin 3/30. Follow-up chest x-ray 3/31.  Acute kidney failure with hypernatremia/ hyperchloremia -Likely secondary to profound dehydration related to poor oral intake.  -Creatinine 1.3 on 05/06/2015 - With IV normal saline hydration, creatinine has gradually improved from 8.5 on admission to 4.4 on 3/30. Due to hypernatremia,  fluids had been changed to half-normal saline on 3/29 but sodium is increased to 165 and chloride is >130 - Discussed with nephrologist on call 3/30 who recommends both IV normal saline for sodium replacement related to dehydration and D5 infusion for water replacement. Follow BMP closely.  - Patient has great urine output.  Deep tissue injury/pressure sore -Foam dressings per nursing staff.  Prolonged QTc interval -Hold Geodon. Avoid medications that prolong QTC. Follow with repeat EKG.   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 - Acute encephalopathy is related to acute metabolic derangement complicating underlying dementia. CT head without acute findings.   Thrombocytopenia  -Likely related to acute infection. -Continue to monitor CBC. No evidence of bleeding . Stable.   Elevated troponin -Likely demand ischemia in the setting of sepsis. -peak troponin 0.08, down to 0.06 (flat) -Continue aspirin -Patient is not likely a candidate for aggressive intervention, and would not work this up further.  Code Status: DNR  Family Communication: None at bedside. Left message for son. Disposition Plan: Admitted. Pending improvement in Cr.   Time Spent in minutes   30 minutes  Procedures   coud catheter   Consults   None  DVT Prophylaxis  Heparin   Medications  Scheduled Meds: . antiseptic oral rinse  7 mL Mouth Rinse q12n4p  . aspirin  300 mg Rectal Daily  . chlorhexidine  15 mL Mouth Rinse BID  . fluticasone  2 spray Each Nare Daily  . heparin  5,000 Units Subcutaneous 3 times per day  . piperacillin-tazobactam (ZOSYN)  IV  3.375 g Intravenous Q8H   Continuous Infusions: . sodium chloride 75 mL/hr at 07/14/15 1522  . dextrose 125 mL/hr at 07/14/15 0750   PRN Meds:.acetaminophen **OR** acetaminophen, bisacodyl, ondansetron **OR** ondansetron  (ZOFRAN) IV  Antibiotics    Anti-infectives    Start     Dose/Rate Route Frequency Ordered Stop   07/14/15 1800  piperacillin-tazobactam (ZOSYN) IVPB 3.375 g     3.375 g 12.5 mL/hr over 240 Minutes Intravenous Every 8 hours 07/14/15 1134     07/14/15 1200  vancomycin (VANCOCIN) IVPB 1000 mg/200 mL premix     1,000 mg 200 mL/hr over 60 Minutes Intravenous  Once 07/14/15 1124 07/14/15 1247   07/12/15 1800  piperacillin-tazobactam (ZOSYN) IVPB 2.25 g  Status:  Discontinued     2.25 g 100 mL/hr over 30 Minutes Intravenous Every 8 hours 07/12/15 1141 07/12/15 1155   07/12/15 1800  piperacillin-tazobactam (ZOSYN) IVPB 2.25 g  Status:  Discontinued     2.25 g 100 mL/hr over 30 Minutes Intravenous Every 8 hours 07/12/15 1315 07/14/15 1134   07/12/15 1415  piperacillin-tazobactam (ZOSYN) IVPB 3.375 g  Status:  Discontinued     3.375 g 100 mL/hr over 30 Minutes Intravenous  Once 07/12/15 1405 07/12/15 1425   07/12/15 1415  vancomycin (VANCOCIN) IVPB 1000 mg/200 mL premix  Status:  Discontinued     1,000 mg 200 mL/hr over 60 Minutes Intravenous  Once 07/12/15 1405 07/12/15 1425   07/12/15 1330  vancomycin (VANCOCIN) IVPB 1000 mg/200 mL premix     1,000 mg 200 mL/hr over 60 Minutes Intravenous NOW 07/12/15 1315 07/12/15 1616   07/12/15 1145  vancomycin (VANCOCIN) IVPB 1000 mg/200 mL premix  Status:  Discontinued     1,000 mg 200 mL/hr over 60 Minutes Intravenous NOW 07/12/15 1141 07/12/15 1155   07/12/15 0945  piperacillin-tazobactam (ZOSYN) IVPB 3.375 g     3.375 g 100 mL/hr over 30 Minutes Intravenous  Once 07/12/15 0934 07/12/15 1040   07/12/15 0945  vancomycin (VANCOCIN) IVPB 1000 mg/200 mL premix     1,000 mg 200 mL/hr over 60 Minutes Intravenous  Once 07/12/15 0934 07/12/15 1155     Subjective:   Patient is nonverbal. Does not follow instructions. As per RN, no acute issues.   Objective:   Filed Vitals:   07/13/15 2130 07/14/15 0550 07/14/15 0813 07/14/15 1342  BP: 127/64  130/66 143/68 115/88  Pulse: 87 71 67 51  Temp: 97.4 F (36.3 C) 97.4 F (36.3 C) 97.7 F (36.5 C) 97.5 F (36.4 C)  TempSrc: Axillary Axillary Axillary Axillary  Resp: 18 18 15 16   Height:      Weight:      SpO2: 95% 98% 97% 99%    Wt Readings from Last 3 Encounters:  07/12/15 95.255 kg (210 lb)  05/06/15 95.255 kg (210 lb)     Intake/Output Summary (Last 24 hours) at 07/14/15 1526 Last data filed at 07/14/15 1100  Gross per 24 hour  Intake      0 ml  Output   4100 ml  Net  -4100 ml    Exam  Gen. exam: Elderly male, moderately built and nourished, lying comfortably  propped up in bed. Mouth/mucosa extremely dry. Reduced skin turgor.  RS: Poor inspiratory effort but seems clear to auscultation. No increased work of breathing.    CVS: S1 and S2 heard, RRR. No JVD, murmurs or pedal edema.  Abdomen: Nondistended, soft and nontender. Normal bowel sounds heard.  CNS: Somnolent but easily arousable but not oriented. Nonverbal. Does not follow instructions.   Data Review   Micro Results Recent Results (from the past 240 hour(s))  Blood Culture (routine x 2)     Status: None (Preliminary result)   Collection Time: 07/12/15 10:00 AM  Result Value Ref Range Status   Specimen Description BLOOD RIGHT ANTECUBITAL  Final   Special Requests BOTTLES DRAWN AEROBIC AND ANAEROBIC 5ML  Final   Culture   Final    NO GROWTH 2 DAYS Performed at [facility]    Report Status PENDING  Incomplete  Blood Culture (routine x 2)     Status: None (Preliminary result)   Collection Time: 07/12/15 10:06 AM  Result Value Ref Range Status   Specimen Description BLOOD RIGHT HAND  Final   Special Requests BOTTLES DRAWN AEROBIC ONLY 5ML  Final   Culture   Final    NO GROWTH 2 DAYS Performed at [facility]    Report Status PENDING  Incomplete  Urine culture     Status: None   Collection Time: 07/12/15 10:51 AM  Result Value Ref Range Status   Specimen Description URINE,  CATHETERIZED  Final   Special Requests NONE  Final   Culture   Final    NO GROWTH 1 DAY Performed at [facility]    Report Status 07/13/2015 FINAL  Final  MRSA PCR Screening     Status: None   Collection Time: 07/12/15  8:05 PM  Result Value Ref Range Status   MRSA by PCR NEGATIVE NEGATIVE Final    Comment:        The GeneXpert MRSA Assay (FDA approved for NASAL specimens only), is one component of a comprehensive MRSA colonization surveillance program. It is not intended to diagnose MRSA infection nor to guide or monitor treatment for MRSA infections.     Radiology Reports Ct Head Wo Contrast  07/12/2015  CLINICAL DATA:  Dementia, bladder cancer, altered mental status EXAM: CT HEAD WITHOUT CONTRAST TECHNIQUE: Contiguous axial images were obtained from the base of the skull through the vertex without intravenous contrast. COMPARISON:  05/06/2015 FINDINGS: Brain: No intracranial hemorrhage, mass effect or midline shift. No acute cortical infarction. No mass lesion is noted on this unenhanced scan. Moderate cerebral atrophy again noted. Stable chronic white matter disease. Vascular: Atherosclerotic calcifications of carotid siphon. Skull: Negative for fracture or focal lesion. Sinuses/Orbits: No acute findings. Other: None. IMPRESSION: No acute intracranial abnormality. Moderate cerebral atrophy again noted. Stable periventricular and patchy subcortical chronic white matter disease. No definite acute cortical infarction. Ventricular size is stable from prior exam. Electronically Signed   By: Lahoma Crocker M.D.   On: 07/12/2015 10:46   Dg Chest Port 1 View  07/12/2015  CLINICAL DATA:  Sepsis EXAM: PORTABLE CHEST 1 VIEW COMPARISON:  05/07/2015 FINDINGS: Cardiac shadow is within normal limits. Elevation of left hemidiaphragm is noted and stable. Some mild increased density is noted in the right upper lobe which may represent early infiltrate. No bony abnormality is seen. IMPRESSION:  Likely early infiltrate in the right upper lobe. Electronically Signed   By: Inez Catalina M.D.   On: 07/12/2015 14:35  CBC  Recent Labs Lab 07/12/15 1009 07/13/15 0144 07/14/15 0530  WBC 15.4* 7.7 8.2  HGB 13.7 11.3* 12.3*  HCT 42.3 35.3* 37.7*  PLT 134* 96* 117*  MCV 94.0 94.6 94.0  MCH 30.4 30.3 30.7  MCHC 32.4 32.0 32.6  RDW 14.0 14.0 14.1    Chemistries   Recent Labs Lab 07/12/15 1005 07/13/15 0144 07/14/15 0530  NA 156* 158* 161*  K 4.4 3.7 4.2  CL 115* 127* >130*  CO2 22 17* 21*  GLUCOSE 142* 119* 102*  BUN 133* 119* 89*  CREATININE 8.50* 6.22* 4.04*  CALCIUM 9.2 7.8* 8.6*  AST 92*  --   --   ALT 60  --   --   ALKPHOS 95  --   --   BILITOT 0.5  --   --    ------------------------------------------------------------------------------------------------------------------ estimated creatinine clearance is 19.5 mL/min (by C-G formula based on Cr of 4.04). ------------------------------------------------------------------------------------------------------------------ No results for input(s): HGBA1C in the last 72 hours. ------------------------------------------------------------------------------------------------------------------ No results for input(s): CHOL, HDL, LDLCALC, TRIG, CHOLHDL, LDLDIRECT in the last 72 hours. ------------------------------------------------------------------------------------------------------------------ No results for input(s): TSH, T4TOTAL, T3FREE, THYROIDAB in the last 72 hours.  Invalid input(s): FREET3 ------------------------------------------------------------------------------------------------------------------ No results for input(s): VITAMINB12, FOLATE, FERRITIN, TIBC, IRON, RETICCTPCT in the last 72 hours.  Coagulation profile  Recent Labs Lab 07/12/15 1605  INR 1.57*    No results for input(s): DDIMER in the last 72 hours.  Cardiac Enzymes  Recent Labs Lab 07/12/15 1605 07/12/15 2202 07/13/15 0144    TROPONINI 0.08* 0.06* 0.06*   ------------------------------------------------------------------------------------------------------------------ Invalid input(s): POCBNP    Whitley Strycharz, MD, FACP, FHM. [facility] Pager [phone]  If 7PM-7AM, please contact night-coverage [email or web address] Password [facility] 07/14/2015, 3:38 PM

## 2015-07-14 NOTE — Progress Notes (Addendum)
Per discussion with son, patient's baseline prior to admission included ambulating independently. Also, since November 2016, son states that patient has been communicating much less than normal due to dementia. He stated that he was answering questions appropriately prior to admission and was able to feed himself. Yesterday (07/13/15), son stated that patient was able to recognize family and state who they were.

## 2015-07-15 ENCOUNTER — Inpatient Hospital Stay (HOSPITAL_COMMUNITY): Payer: Medicare Other

## 2015-07-15 LAB — BASIC METABOLIC PANEL
ANION GAP: 6 (ref 5–15)
ANION GAP: 7 (ref 5–15)
Anion gap: 7 (ref 5–15)
BUN: 70 mg/dL — ABNORMAL HIGH (ref 6–20)
BUN: 59 mg/dL — ABNORMAL HIGH (ref 6–20)
BUN: 49 mg/dL — ABNORMAL HIGH (ref 6–20)
CALCIUM: 8.5 mg/dL — AB (ref 8.9–10.3)
CHLORIDE: 128 mmol/L — AB (ref 101–111)
CHLORIDE: 125 mmol/L — AB (ref 101–111)
CHLORIDE: 129 mmol/L — AB (ref 101–111)
CO2: 23 mmol/L (ref 22–32)
CO2: 23 mmol/L (ref 22–32)
CO2: 23 mmol/L (ref 22–32)
CREATININE: 2.49 mg/dL — AB (ref 0.61–1.24)
CREATININE: 2.42 mg/dL — AB (ref 0.61–1.24)
Calcium: 8.5 mg/dL — ABNORMAL LOW (ref 8.9–10.3)
Calcium: 8.3 mg/dL — ABNORMAL LOW (ref 8.9–10.3)
Creatinine, Ser: 2.88 mg/dL — ABNORMAL HIGH (ref 0.61–1.24)
GFR calc Af Amer: 23 mL/min — ABNORMAL LOW (ref >60)
GFR calc non Af Amer: 24 mL/min — ABNORMAL LOW (ref >60)
GFR calc non Af Amer: 25 mL/min — ABNORMAL LOW (ref >60)
GFR, EST AFRICAN AMERICAN: 28 mL/min — AB (ref >60)
GFR, EST AFRICAN AMERICAN: 29 mL/min — AB (ref >60)
GFR, EST NON AFRICAN AMERICAN: 20 mL/min — AB (ref >60)
Glucose, Bld: 147 mg/dL — ABNORMAL HIGH (ref 65–99)
Glucose, Bld: 136 mg/dL — ABNORMAL HIGH (ref 65–99)
Glucose, Bld: 124 mg/dL — ABNORMAL HIGH (ref 65–99)
POTASSIUM: 3.7 mmol/L (ref 3.5–5.1)
POTASSIUM: 4 mmol/L (ref 3.5–5.1)
Potassium: 4.3 mmol/L (ref 3.5–5.1)
SODIUM: 158 mmol/L — AB (ref 135–145)
SODIUM: 155 mmol/L — AB (ref 135–145)
SODIUM: 158 mmol/L — AB (ref 135–145)

## 2015-07-15 LAB — CBC
HEMATOCRIT: 35.4 % — AB (ref 39.0–52.0)
HEMOGLOBIN: 11.5 g/dL — AB (ref 13.0–17.0)
MCH: 30.3 pg (ref 26.0–34.0)
MCHC: 32.5 g/dL (ref 30.0–36.0)
MCV: 93.2 fL (ref 78.0–100.0)
Platelets: 128 10*3/uL — ABNORMAL LOW (ref 150–400)
RBC: 3.8 MIL/uL — AB (ref 4.22–5.81)
RDW: 14 % (ref 11.5–15.5)
WBC: 7 10*3/uL (ref 4.0–10.5)

## 2015-07-15 MED ORDER — SODIUM CHLORIDE 0.9 % IV SOLN: INTRAVENOUS | Status: DC

## 2015-07-15 NOTE — Evaluation (Signed)
Clinical/Bedside Swallow Evaluation Patient Details  Name: Roberto Campbell MRN: [id number] Date of Birth: 03-20-1941  Today's Date: 07/15/2015 Time: SLP Start Time (ACUTE ONLY): 22 SLP Stop Time (ACUTE ONLY): 1620 SLP Time Calculation (min) (ACUTE ONLY): 30 min  Past Medical History:  Past Medical History  Diagnosis Date  . Dementia   . Bladder cancer ([place])   . HTN (hypertension)   . CKD (chronic kidney disease)    Past Surgical History:  Past Surgical History  Procedure Laterality Date  . Back surgery    . Minor hemorrhoidectomy    . Cholecystectomy     HPI:  75 year old male admitted 07/12/15 due to AMS, decline over the past few weeks. PMH signiciant for bladder CA, dementia.   Assessment / Plan / Recommendation Clinical Impression  Oral care completed with suction. Pt was largely nonvocal. Minimal vocalizations had clear voice quality. No overt s/s aspiration of any consistency tested. Son reports pt tolerating regular diet last week, with no history of swallowing difficulty. Son/HCPOA is trying to decide about PEG tube placement if the need arises. Given cognitive impairment and presentation at bedside, recommend conservative diet of puree and nectar thick liquids until MBS can be completed, to determine least restrictive diet. RN and MD are aware of results and recommendations. Safe swallow precautions posted at [facility] and reviewed with pt's sons. Recommend 1:1 supervision to determine appropriateness for po intake (alertness, positioning), and manage bolus size and rate.    Aspiration Risk  Moderate aspiration risk    Diet Recommendation Dysphagia 1 (Puree);Nectar-thick liquid   Liquid Administration via: Cup;Straw Medication Administration: Crushed with puree Supervision: Full supervision/cueing for compensatory strategies Compensations: Minimize environmental distractions;Small sips/bites;Slow rate Postural Changes: Seated upright at 90 degrees;Remain upright for at least  30 minutes after po intake    Other  Recommendations Oral Care Recommendations: Oral care BID   Follow up Recommendations       Frequency and Duration  (pending MBS)          Prognosis Prognosis for Safe Diet Advancement: Fair Barriers to Reach Goals: Cognitive deficits      Swallow Study   General Date of Onset: 07/12/15 HPI: 75 year old male admitted 07/12/15 due to AMS, decline over the past few weeks. PMH signiciant for bladder CA, dementia. Type of Study: Bedside Swallow Evaluation Previous Swallow Assessment: none Diet Prior to this Study: NPO Temperature Spikes Noted: No Respiratory Status: Room air History of Recent Intubation: No Behavior/Cognition: Alert;Cooperative;Confused;Doesn't follow directions;Requires cueing Oral Cavity Assessment: Within Functional Limits Oral Care Completed by SLP: Yes Oral Cavity - Dentition: Adequate natural dentition Self-Feeding Abilities: Total assist (in mitten retraints) Patient Positioning: Upright in bed Baseline Vocal Quality: Normal Volitional Cough: Cognitively unable to elicit Volitional Swallow: Unable to elicit    Oral/Motor/Sensory Function Overall Oral Motor/Sensory Function: Within functional limits (as best determined)   Ice Chips Ice chips: Within functional limits Presentation: Spoon   Thin Liquid Thin Liquid: Within functional limits Presentation: Straw;Cup    Nectar Thick Nectar Thick Liquid: Not tested   Honey Thick Honey Thick Liquid: Not tested   Puree Puree: Within functional limits Presentation: Spoon   Solid   GO   Solid: Not tested        Shonna Chock 07/15/2015,4:51 PM  Enriqueta Shutter. Quentin Ore [facility], [place] [phone]

## 2015-07-15 NOTE — Care Management Important Message (Signed)
Important Message  Patient Details  Name: Roberto Campbell MRN: [id number] Date of Birth: 09/26/1940   Medicare Important Message Given:  Yes    [name] 07/15/2015, 9:17 AMImportant Message  Patient Details  Name: [name] MRN: [id number] Date of Birth: [birth year]   Medicare Important Message Given:  Yes    [name] 07/15/2015, 9:17 AM

## 2015-07-15 NOTE — Progress Notes (Signed)
Patient's HR sustaining in low 40's and high 30's. Other vitals are stable and patient is resting comfortably. Walden Field made aware. Nellene Courtois, Bing Neighbors, RN

## 2015-07-15 NOTE — Progress Notes (Signed)
[facility]                                                                              Patient Demographics  Roberto Campbell, is a 75 y.o. male, DOB - 1940/07/13, [id number]  Admit date - 07/12/2015   Admitting Physician Venetia Maxon Rama, MD  Outpatient Primary MD for the patient is No primary care provider on file.  LOS - 3   Chief Complaint  Patient presents with  . Altered Mental Status      Interim history  75 year old male with a PMH of dementia, bladder cancer, HTN, chronic kidney disease, a resident of SNF, sent to the ED on 07/12/15 with two-week history of gradual decline in his usual condition, not been eating or drinking well or getting out of bed. In the ED he was nonverbal and unable to provide any history. Initial evaluation showed markedly elevated creatinine and hypotension concerning for sepsis etiology but blood pressure responded to IV fluid challenge. Admitted for sepsis, acute kidney injury and hypernatremia. As per RN discussion with son today, patient was ambulating independently PTA but since November 2016, he has been communicating less.  Assessment & Plan  Sepsis ([place]) secondary to Possible HCAP -Sepsis order set utilized.  -Upon admission, patient was tachypneic with leukocytosis, lactic acidosis and hypotension -UA showed many bacteria, and the PBC 0-5, positive nitrites, small leukocytes -Urine culture Negative -Chest x-ray: Likely early infiltrate in the right upper lobe -Blood cultures currently show no growth to date -Strep pneumonia urine antigen negative - Treated empirically with IV vancomycin and Zosyn. DC vancomycin 3/30.  -  Chest x-ray 3/31 shows prominent left lower lobe infiltrate, mild right upper lobe and right lower lobe infiltrate which are new from prior exam and suspicious for aspiration. Continue IV Zosyn for now and transition to oral Augmentin when able. - Discussed with speech therapy who have started dysphagia 1 diet and  nectar thickened liquids with plans for further evaluation 07/16/15.  Acute kidney failure with hypernatremia/ hyperchloremia -Likely secondary to profound dehydration related to poor oral intake.  -Creatinine 1.3 on 05/06/2015 - With IV normal saline hydration, creatinine has gradually improved from 8.5 on admission to 2.49 on 3/31. Due to hypernatremia, discussed with nephrologist on call 3/30 who recommended both IV normal saline for sodium replacement related to dehydration and D5 infusion for water replacement.  - Patient has great urine output. - Persistent hypernatremia and hyperkalemia (158 & 129 respectively). Discussed again with nephrology on call. Free water deficit calculated at 7.35 L. Recommended replacing half of that over the next 24 hours with free water but patient unable to take by mouth water safely and hence D5 infusion at 175 ML per hour with close monitoring of BMP and aim to reduce by about 10-12 milliequivalents per 24 hours. - Mental status improving.  Deep tissue injury/pressure sore -Foam dressings per nursing staff.  Prolonged QTc interval -Hold Geodon. Avoid medications that prolong QTC. Follow with repeat EKG-QTC 408. Resolved. However bradycardia noted at 44/m. We'll briefly place on telemetry to closely monitor.  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 -  Acute encephalopathy is related to acute metabolic derangement complicating underlying dementia. CT head without acute findings.  - Mental status improving.  Thrombocytopenia  -Likely related to acute infection. -Continue to monitor CBC. No evidence of bleeding . Stable.   Elevated troponin -Likely demand ischemia in the setting of sepsis. -peak troponin 0.08, down to 0.06 (flat) -Continue aspirin -Patient is not likely a candidate for aggressive intervention, and would not work this  up further.  Code Status: DNR  Family Communication: Discussed with son and daughter-in-law who is an Therapist, sports on 3/30. Disposition Plan: Admitted. Pending improvement in Cr.   Time Spent in minutes   30 minutes  Procedures   coud catheter   Consults   None  DVT Prophylaxis  Heparin   Medications  Scheduled Meds: . antiseptic oral rinse  7 mL Mouth Rinse q12n4p  . aspirin  300 mg Rectal Daily  . chlorhexidine  15 mL Mouth Rinse BID  . fluticasone  2 spray Each Nare Daily  . heparin  5,000 Units Subcutaneous 3 times per day  . piperacillin-tazobactam (ZOSYN)  IV  3.375 g Intravenous Q8H   Continuous Infusions: . dextrose 175 mL/hr at 07/15/15 1358   PRN Meds:.acetaminophen **OR** acetaminophen, bisacodyl  Antibiotics    Anti-infectives    Start     Dose/Rate Route Frequency Ordered Stop   07/14/15 1800  piperacillin-tazobactam (ZOSYN) IVPB 3.375 g     3.375 g 12.5 mL/hr over 240 Minutes Intravenous Every 8 hours 07/14/15 1134     07/14/15 1200  vancomycin (VANCOCIN) IVPB 1000 mg/200 mL premix     1,000 mg 200 mL/hr over 60 Minutes Intravenous  Once 07/14/15 1124 07/14/15 1247   07/12/15 1800  piperacillin-tazobactam (ZOSYN) IVPB 2.25 g  Status:  Discontinued     2.25 g 100 mL/hr over 30 Minutes Intravenous Every 8 hours 07/12/15 1141 07/12/15 1155   07/12/15 1800  piperacillin-tazobactam (ZOSYN) IVPB 2.25 g  Status:  Discontinued     2.25 g 100 mL/hr over 30 Minutes Intravenous Every 8 hours 07/12/15 1315 07/14/15 1134   07/12/15 1415  piperacillin-tazobactam (ZOSYN) IVPB 3.375 g  Status:  Discontinued     3.375 g 100 mL/hr over 30 Minutes Intravenous  Once 07/12/15 1405 07/12/15 1425   07/12/15 1415  vancomycin (VANCOCIN) IVPB 1000 mg/200 mL premix  Status:  Discontinued     1,000 mg 200 mL/hr over 60 Minutes Intravenous  Once 07/12/15 1405 07/12/15 1425   07/12/15 1330  vancomycin (VANCOCIN) IVPB 1000 mg/200 mL premix     1,000 mg 200 mL/hr over 60 Minutes  Intravenous NOW 07/12/15 1315 07/12/15 1616   07/12/15 1145  vancomycin (VANCOCIN) IVPB 1000 mg/200 mL premix  Status:  Discontinued     1,000 mg 200 mL/hr over 60 Minutes Intravenous NOW 07/12/15 1141 07/12/15 1155   07/12/15 0945  piperacillin-tazobactam (ZOSYN) IVPB 3.375 g     3.375 g 100 mL/hr over 30 Minutes Intravenous  Once 07/12/15 0934 07/12/15 1040   07/12/15 0945  vancomycin (VANCOCIN) IVPB 1000 mg/200 mL premix     1,000 mg 200 mL/hr over 60 Minutes Intravenous  Once 07/12/15 0934 07/12/15 1155     Subjective:   Patient was more alert this morning and also subsequently during speech therapy evaluation. Not saying much though.  Objective:   Filed Vitals:   07/14/15 1342 07/14/15 2035 07/15/15 0501 07/15/15 1415  BP: 115/88 111/51 137/55 146/57  Pulse: 51 63 67 43  Temp: 97.5 F (36.4 C)  97.6 F (36.4 C) 97.4 F (36.3 C) 98.2 F (36.8 C)  TempSrc: Axillary Axillary Axillary Axillary  Resp: 16 17 18 16   Height:      Weight:      SpO2: 99% 98% 95% 100%    Wt Readings from Last 3 Encounters:  07/12/15 95.255 kg (210 lb)  05/06/15 95.255 kg (210 lb)     Intake/Output Summary (Last 24 hours) at 07/15/15 1822 Last data filed at 07/15/15 0827  Gross per 24 hour  Intake      0 ml  Output   1825 ml  Net  -1825 ml    Exam  Gen. exam: Elderly male, moderately built and nourished, lying comfortably propped up in bed. Mouth/mucosa dry. Reduced skin turgor.  RS: Poor inspiratory effort but seems clear to auscultation. No increased work of breathing.    CVS: S1 and S2 heard, RRR. No JVD, murmurs or pedal edema.  Abdomen: Nondistended, soft and nontender. Normal bowel sounds heard.  CNS: More alert this morning. Looking around. Nonverbal. Does not follow instructions.   Data Review   Micro Results Recent Results (from the past 240 hour(s))  Blood Culture (routine x 2)     Status: None (Preliminary result)   Collection Time: 07/12/15 10:00 AM  Result Value  Ref Range Status   Specimen Description BLOOD RIGHT ANTECUBITAL  Final   Special Requests BOTTLES DRAWN AEROBIC AND ANAEROBIC 5ML  Final   Culture   Final    NO GROWTH 3 DAYS Performed at [facility]    Report Status PENDING  Incomplete  Blood Culture (routine x 2)     Status: None (Preliminary result)   Collection Time: 07/12/15 10:06 AM  Result Value Ref Range Status   Specimen Description BLOOD RIGHT HAND  Final   Special Requests BOTTLES DRAWN AEROBIC ONLY 5ML  Final   Culture   Final    NO GROWTH 3 DAYS Performed at [facility]    Report Status PENDING  Incomplete  Urine culture     Status: None   Collection Time: 07/12/15 10:51 AM  Result Value Ref Range Status   Specimen Description URINE, CATHETERIZED  Final   Special Requests NONE  Final   Culture   Final    NO GROWTH 1 DAY Performed at [facility]    Report Status 07/13/2015 FINAL  Final  MRSA PCR Screening     Status: None   Collection Time: 07/12/15  8:05 PM  Result Value Ref Range Status   MRSA by PCR NEGATIVE NEGATIVE Final    Comment:        The GeneXpert MRSA Assay (FDA approved for NASAL specimens only), is one component of a comprehensive MRSA colonization surveillance program. It is not intended to diagnose MRSA infection nor to guide or monitor treatment for MRSA infections.     Radiology Reports Ct Head Wo Contrast  07/12/2015  CLINICAL DATA:  Dementia, bladder cancer, altered mental status EXAM: CT HEAD WITHOUT CONTRAST TECHNIQUE: Contiguous axial images were obtained from the base of the skull through the vertex without intravenous contrast. COMPARISON:  05/06/2015 FINDINGS: Brain: No intracranial hemorrhage, mass effect or midline shift. No acute cortical infarction. No mass lesion is noted on this unenhanced scan. Moderate cerebral atrophy again noted. Stable chronic white matter disease. Vascular: Atherosclerotic calcifications of carotid siphon. Skull: Negative for  fracture or focal lesion. Sinuses/Orbits: No acute findings. Other: None. IMPRESSION: No acute intracranial abnormality. Moderate cerebral atrophy again  noted. Stable periventricular and patchy subcortical chronic white matter disease. No definite acute cortical infarction. Ventricular size is stable from prior exam. Electronically Signed   By: Lahoma Crocker M.D.   On: 07/12/2015 10:46   Dg Chest Port 1 View  07/15/2015  CLINICAL DATA:  Pneumonia. EXAM: PORTABLE CHEST 1 VIEW COMPARISON:  07/12/2015 . FINDINGS: Mediastinum and hilar structures normal. Left lower lobe infiltrate noted consistent pneumonia. Mild right upper lobe and right base infiltrate. These findings are new. Small left pleural effusion cannot be excluded. No pneumothorax. Interposition of bowel under the left hemidiaphragm again noted . IMPRESSION: Prominent left lower lobe infiltrate. Mild right upper lobe and right lower lobe infiltrate. These findings are new from prior exam . Aspiration cannot be excluded. Small left pleural effusion cannot be excluded. Electronically Signed   By: Marcello Moores  Register   On: 07/15/2015 07:24   Dg Chest Port 1 View  07/12/2015  CLINICAL DATA:  Sepsis EXAM: PORTABLE CHEST 1 VIEW COMPARISON:  05/07/2015 FINDINGS: Cardiac shadow is within normal limits. Elevation of left hemidiaphragm is noted and stable. Some mild increased density is noted in the right upper lobe which may represent early infiltrate. No bony abnormality is seen. IMPRESSION: Likely early infiltrate in the right upper lobe. Electronically Signed   By: Inez Catalina M.D.   On: 07/12/2015 14:35    CBC  Recent Labs Lab 07/12/15 1009 07/13/15 0144 07/14/15 0530 07/15/15 0042  WBC 15.4* 7.7 8.2 7.0  HGB 13.7 11.3* 12.3* 11.5*  HCT 42.3 35.3* 37.7* 35.4*  PLT 134* 96* 117* 128*  MCV 94.0 94.6 94.0 93.2  MCH 30.4 30.3 30.7 30.3  MCHC 32.4 32.0 32.6 32.5  RDW 14.0 14.0 14.1 14.0    Chemistries   Recent Labs Lab 07/12/15 1005  07/13/15 0144 07/14/15 0530 07/14/15 1706 07/15/15 0042 07/15/15 0921  NA 156* 158* 161* 157* 158* 158*  K 4.4 3.7 4.2 3.4* 4.3 3.7  CL 115* 127* >130* 129* 128* 129*  CO2 22 17* 21* 19* 23 23  GLUCOSE 142* 119* 102* 143* 147* 136*  BUN 133* 119* 89* 79* 70* 59*  CREATININE 8.50* 6.22* 4.04* 3.04* 2.88* 2.49*  CALCIUM 9.2 7.8* 8.6* 8.3* 8.5* 8.5*  AST 92*  --   --   --   --   --   ALT 60  --   --   --   --   --   ALKPHOS 95  --   --   --   --   --   BILITOT 0.5  --   --   --   --   --    ------------------------------------------------------------------------------------------------------------------ estimated creatinine clearance is 31.7 mL/min (by C-G formula based on Cr of 2.49). ------------------------------------------------------------------------------------------------------------------ No results for input(s): HGBA1C in the last 72 hours. ------------------------------------------------------------------------------------------------------------------ No results for input(s): CHOL, HDL, LDLCALC, TRIG, CHOLHDL, LDLDIRECT in the last 72 hours. ------------------------------------------------------------------------------------------------------------------ No results for input(s): TSH, T4TOTAL, T3FREE, THYROIDAB in the last 72 hours.  Invalid input(s): FREET3 ------------------------------------------------------------------------------------------------------------------ No results for input(s): VITAMINB12, FOLATE, FERRITIN, TIBC, IRON, RETICCTPCT in the last 72 hours.  Coagulation profile  Recent Labs Lab 07/12/15 1605  INR 1.57*    No results for input(s): DDIMER in the last 72 hours.  Cardiac Enzymes  Recent Labs Lab 07/12/15 1605 07/12/15 2202 07/13/15 0144  TROPONINI 0.08* 0.06* 0.06*   ------------------------------------------------------------------------------------------------------------------ Invalid input(s): POCBNP    Earma Nicolaou, MD,  FACP, FHM. [facility] Pager [phone]  If 7PM-7AM, please contact night-coverage [email or web address] Password  [email or web address] 07/15/2015, 6:22 PM

## 2015-07-16 ENCOUNTER — Inpatient Hospital Stay (HOSPITAL_COMMUNITY): Payer: Medicare Other

## 2015-07-16 LAB — BASIC METABOLIC PANEL
ANION GAP: 8 (ref 5–15)
BUN: 41 mg/dL — ABNORMAL HIGH (ref 6–20)
CALCIUM: 8.2 mg/dL — AB (ref 8.9–10.3)
CO2: 23 mmol/L (ref 22–32)
Chloride: 120 mmol/L — ABNORMAL HIGH (ref 101–111)
Creatinine, Ser: 2.04 mg/dL — ABNORMAL HIGH (ref 0.61–1.24)
GFR, EST AFRICAN AMERICAN: 36 mL/min — AB (ref >60)
GFR, EST NON AFRICAN AMERICAN: 31 mL/min — AB (ref >60)
GLUCOSE: 120 mg/dL — AB (ref 65–99)
POTASSIUM: 3.1 mmol/L — AB (ref 3.5–5.1)
SODIUM: 151 mmol/L — AB (ref 135–145)

## 2015-07-16 LAB — CBC
HEMATOCRIT: 34.3 % — AB (ref 39.0–52.0)
HEMOGLOBIN: 11.4 g/dL — AB (ref 13.0–17.0)
MCH: 30.6 pg (ref 26.0–34.0)
MCHC: 33.2 g/dL (ref 30.0–36.0)
MCV: 92 fL (ref 78.0–100.0)
Platelets: 125 10*3/uL — ABNORMAL LOW (ref 150–400)
RBC: 3.73 MIL/uL — ABNORMAL LOW (ref 4.22–5.81)
RDW: 13.7 % (ref 11.5–15.5)
WBC: 6.8 10*3/uL (ref 4.0–10.5)

## 2015-07-16 MED ORDER — DEXTROSE 5 % IV SOLN
INTRAVENOUS | Status: DC
Start: 2015-07-16 — End: 2015-07-17
  Administered 2015-07-16: 19:00:00 via INTRAVENOUS

## 2015-07-16 MED ORDER — POTASSIUM CHLORIDE 10 MEQ/100ML IV SOLN
10.0000 meq | INTRAVENOUS | Status: AC
  Administered 2015-07-16: 10 meq via INTRAVENOUS
  Filled 2015-07-16 (×4): qty 100

## 2015-07-16 MED ORDER — POTASSIUM CHLORIDE 10 MEQ/100ML IV SOLN
10.0000 meq | INTRAVENOUS | Status: AC
  Administered 2015-07-16 (×4): 10 meq via INTRAVENOUS
  Filled 2015-07-16 (×4): qty 100

## 2015-07-16 MED ORDER — DEXTROSE 5 % IV SOLN: INTRAVENOUS | Status: AC

## 2015-07-16 NOTE — Procedures (Addendum)
Objective Swallowing Evaluation: Type of Study: Bedside Swallow Evaluation  Patient Details  Name: Roberto Campbell MRN: [id number] Date of Birth: 12-Apr-1941  Today's Date: 07/16/2015 Time: SLP Start Time (ACUTE ONLY): 1305-SLP Stop Time (ACUTE ONLY): 1340 SLP Time Calculation (min) (ACUTE ONLY): 35 min  Past Medical History:  Past Medical History  Diagnosis Date  . Dementia   . Bladder cancer ([place])   . HTN (hypertension)   . CKD (chronic kidney disease)    Past Surgical History:  Past Surgical History  Procedure Laterality Date  . Back surgery    . Minor hemorrhoidectomy    . Cholecystectomy     HPI: 75 year old male admitted 07/12/15 due to AMS, decline over the past few weeks. PMH signiciant for bladder CA, dementia.  07/15/15 CXR could not rule out aspiration pna.  BSE completed with recommendations for dys1/nectar and MBS.    Subjective: Pt resting in bed  Assessment / Plan / Recommendation  CHL IP CLINICAL IMPRESSIONS 07/16/2015  Therapy Diagnosis Moderate oral phase dysphagia;Moderate pharyngeal phase dysphagia  Clinical Impression Moderate oropharyngeal sensormotor dysphagia characterized by delayed oral transiting and premature loss of boluses into pharynx due to poor lingual control.  Pharyngeal swallow was delayed to pyriform sinus with liquids and weakness resulted in residauls *worse with increased viscocity.  Pt did not dry swallow on command to faciliate clearance.  There was not significant aspiration but pt is at risk due to weakness and inability to follow directions.  Liquid consumption faciliated pharyngeal clearance of pudding/cracker.  At this time, due to pt's weakness, questionable aspiration, recommend continue dys1/nectar diet with strict precautions.  Skilled intervention included determining which compensation strategies were effective for pt.  SLP to follow for family education.    Impact on safety and function Moderate aspiration risk      CHL IP TREATMENT  RECOMMENDATION 07/16/2015  Treatment Recommendations Therapy as outlined in treatment plan below     Prognosis 07/16/2015  Prognosis for Safe Diet Advancement Fair  Barriers to Reach Goals Cognitive deficits  Barriers/Prognosis Comment --    CHL IP DIET RECOMMENDATION 07/16/2015  SLP Diet Recommendations Nectar thick liquid;Dysphagia 1 (Puree) solids  Liquid Administration via Cup  Medication Administration Crushed with puree  Compensations Slow rate;Small sips/bites;Follow solids with liquid  Postural Changes Remain semi-upright after after feeds/meals (Comment)      CHL IP OTHER RECOMMENDATIONS 07/16/2015  Recommended Consults --  Oral Care Recommendations Oral care BID  Other Recommendations Order thickener from pharmacy      CHL IP FOLLOW UP RECOMMENDATIONS 07/16/2015  Follow up Recommendations Home health SLP      CHL IP FREQUENCY AND DURATION 07/16/2015  Speech Therapy Frequency (ACUTE ONLY) min 2x/week  Treatment Duration 1 week           CHL IP ORAL PHASE 07/16/2015  Oral Phase Impaired  Oral - Pudding Teaspoon --  Oral - Pudding Cup --  Oral - Honey Teaspoon --  Oral - Honey Cup --  Oral - Nectar Teaspoon --  Oral - Nectar Cup Weak lingual manipulation;Lingual pumping;Reduced posterior propulsion;Premature spillage;Delayed oral transit  Oral - Nectar Straw Weak lingual manipulation;Lingual pumping;Premature spillage;Delayed oral transit;Reduced posterior propulsion  Oral - Thin Teaspoon Weak lingual manipulation;Lingual pumping;Reduced posterior propulsion;Premature spillage  Oral - Thin Cup Weak lingual manipulation;Lingual pumping;Reduced posterior propulsion;Premature spillage  Oral - Thin Straw Weak lingual manipulation;Lingual pumping;Reduced posterior propulsion;Delayed oral transit;Premature spillage  Oral - Puree Weak lingual manipulation;Lingual pumping;Premature spillage;Delayed oral transit;Reduced posterior propulsion  Oral -  Mech Soft --  Oral - Regular Weak  lingual manipulation;Lingual pumping;Delayed oral transit;Impaired mastication;Reduced posterior propulsion;Premature spillage  Oral - Multi-Consistency --  Oral - Pill --  Oral Phase - Comment --    CHL IP PHARYNGEAL PHASE 07/16/2015  Pharyngeal Phase Impaired  Pharyngeal- Pudding Teaspoon --  Pharyngeal --  Pharyngeal- Pudding Cup --  Pharyngeal --  Pharyngeal- Honey Teaspoon --  Pharyngeal --  Pharyngeal- Honey Cup --  Pharyngeal --  Pharyngeal- Nectar Teaspoon Delayed swallow initiation-pyriform sinuses;Reduced pharyngeal peristalsis;Reduced epiglottic inversion;Pharyngeal residue - valleculae;Pharyngeal residue - pyriform  Pharyngeal --  Pharyngeal- Nectar Cup Delayed swallow initiation-pyriform sinuses;Reduced pharyngeal peristalsis;Reduced epiglottic inversion;Pharyngeal residue - valleculae;Pharyngeal residue - pyriform;Penetration/Aspiration during swallow  Pharyngeal Material enters airway, CONTACTS cords and not ejected out  Pharyngeal- Nectar Straw Delayed swallow initiation-pyriform sinuses;Reduced pharyngeal peristalsis;Reduced epiglottic inversion;Pharyngeal residue - valleculae;Pharyngeal residue - pyriform;Penetration/Aspiration during swallow  Pharyngeal Material enters airway, CONTACTS cords and not ejected out;Material enters airway, CONTACTS cords and then ejected out  Pharyngeal- Thin Teaspoon Delayed swallow initiation-pyriform sinuses;Reduced pharyngeal peristalsis;Reduced epiglottic inversion;Pharyngeal residue - valleculae;Pharyngeal residue - pyriform  Pharyngeal --  Pharyngeal- Thin Cup Delayed swallow initiation-pyriform sinuses;Reduced pharyngeal peristalsis;Reduced epiglottic inversion;Pharyngeal residue - valleculae;Pharyngeal residue - pyriform;Penetration/Aspiration during swallow  Pharyngeal Material enters airway, CONTACTS cords and not ejected out  Pharyngeal- Thin Straw Delayed swallow initiation-pyriform sinuses;Reduced pharyngeal peristalsis;Reduced  epiglottic inversion;Pharyngeal residue - valleculae;Pharyngeal residue - pyriform  Pharyngeal --  Pharyngeal- Puree Delayed swallow initiation-vallecula;Reduced pharyngeal peristalsis;Reduced epiglottic inversion;Pharyngeal residue - valleculae  Pharyngeal --  Pharyngeal- Mechanical Soft Delayed swallow initiation-vallecula;Reduced pharyngeal peristalsis;Reduced epiglottic inversion;Pharyngeal residue - valleculae  Pharyngeal --  Pharyngeal- Regular --  Pharyngeal --  Pharyngeal- Multi-consistency --  Pharyngeal --  Pharyngeal- Pill --  Pharyngeal --  Pharyngeal Comment pt did not dry swallow on command nor cough likely due to his mentation     CHL IP CERVICAL ESOPHAGEAL PHASE 07/16/2015  Cervical Esophageal Phase Impaired  Pudding Teaspoon --  Pudding Cup --  Honey Teaspoon --  Honey Cup --  Nectar Teaspoon --  Nectar Cup --  Nectar Straw --  Thin Teaspoon --  Thin Cup --  Thin Straw --  Puree --  Mechanical Soft --  Regular --  Multi-consistency --  Pill --  Cervical Esophageal Comment accumulation of barium at pyriform sinus mixed with secretions, esophageal sweep revealed clear distal esophagus  - radiologist not present to confirm    No flowsheet data found.  Luanna Salk, [facility] SLP [phone]

## 2015-07-16 NOTE — Progress Notes (Signed)
TRH Progress Note                                                                                                                                                                                                                      Patient Demographics:    Roberto Campbell, is a 75 y.o. male, DOB - 29-Oct-1940, [id number]  Admit date - 07/12/2015   Admitting Physician Venetia Maxon Rama, MD  Outpatient Primary MD for the patient is No primary care provider on file.  LOS - 4  Outpatient Specialists:   Chief Complaint  Patient presents with  . Altered Mental Status    Summary  75 year old male with a PMH of dementia, bladder cancer, HTN, chronic kidney disease, a resident of SNF, sent to the ED on 07/12/15 with two-week history of gradual decline in his usual condition, not been eating or drinking well or getting out of bed. In the ED he was nonverbal and unable to provide any history. Initial evaluation showed markedly elevated creatinine and hypotension concerning for sepsis etiology but blood pressure responded to IV fluid challenge. Admitted for sepsis, acute kidney injury and hypernatremia. As per RN discussion with son today, patient was ambulating independently PTA but since November 2016, he has been communicating less.     Subjective:    Roberto Campbell today in bed, poor historian, in no distress, denies any headache, no chest-abd pain.   Assessment  & Plan :     1. Sepsis due to Pneumonia along with likely UTI. Sepsis physiology has resolved with hydration and empiric antibiotics, initially treated with IV vancomycin and Zosyn, vancomycin stopped on 06/27/2015. There is question of possible aspiration due to underlying dementia. Therapies therapy is following and currently on dysphagia 1 diet. Clinically improved. All cultures negative. Continue Zosyn. May transition  to Augmentin soon.  2. Severe dehydration with ARF and hypernatremia. Improved with hydration, changed fluids to D5W continue for another 24 hours.  3. Sacral pressure sore. Only see nursing note for details. Continue supportive care.  4. Prolonged QTC on admission. Resolved. Currently Geodon on hold.  5. Asymptomatic bradycardia. Heart rate into 40s. Blood pressure good. Patient not very active, poor candidate for pacemaker. We will monitor. Not on any rate controlling medications.  6. Sepsis related thrombocytopenia. Platelet count much improved. Continue treatment for underlying cause.  7. Mild troponin rise in non-ACS pattern due to demand ischemia from sepsis. EKG nonacute. No chest pain. On aspirin continue.    Code Status :  DNR  Family Communication  : Son Harrell Gave & [place] over the phone on 07/16/2015,   Disposition Plan  : SNF 1-2 days  Barriers For Discharge : Pneumonia, dehydration, renal failure  Consults  :  Speech  Procedures  :   DVT Prophylaxis  :    Heparin    Lab Results  Component Value Date   PLT 125* 07/16/2015    Antibiotics  :    Anti-infectives    Start     Dose/Rate Route Frequency Ordered Stop   07/14/15 1800  piperacillin-tazobactam (ZOSYN) IVPB 3.375 g     3.375 g 12.5 mL/hr over 240 Minutes Intravenous Every 8 hours 07/14/15 1134     07/14/15 1200  vancomycin (VANCOCIN) IVPB 1000 mg/200 mL premix     1,000 mg 200 mL/hr over 60 Minutes Intravenous  Once 07/14/15 1124 07/14/15 1247   07/12/15 1800  piperacillin-tazobactam (ZOSYN) IVPB 2.25 g  Status:  Discontinued     2.25 g 100 mL/hr over 30 Minutes Intravenous Every 8 hours 07/12/15 1141 07/12/15 1155   07/12/15 1800  piperacillin-tazobactam (ZOSYN) IVPB 2.25 g  Status:  Discontinued     2.25 g 100 mL/hr over 30 Minutes Intravenous Every 8 hours 07/12/15 1315 07/14/15 1134   07/12/15 1415  piperacillin-tazobactam (ZOSYN) IVPB 3.375 g  Status:  Discontinued     3.375 g 100 mL/hr over  30 Minutes Intravenous  Once 07/12/15 1405 07/12/15 1425   07/12/15 1415  vancomycin (VANCOCIN) IVPB 1000 mg/200 mL premix  Status:  Discontinued     1,000 mg 200 mL/hr over 60 Minutes Intravenous  Once 07/12/15 1405 07/12/15 1425   07/12/15 1330  vancomycin (VANCOCIN) IVPB 1000 mg/200 mL premix     1,000 mg 200 mL/hr over 60 Minutes Intravenous NOW 07/12/15 1315 07/12/15 1616   07/12/15 1145  vancomycin (VANCOCIN) IVPB 1000 mg/200 mL premix  Status:  Discontinued     1,000 mg 200 mL/hr over 60 Minutes Intravenous NOW 07/12/15 1141 07/12/15 1155   07/12/15 0945  piperacillin-tazobactam (ZOSYN) IVPB 3.375 g     3.375 g 100 mL/hr over 30 Minutes Intravenous  Once 07/12/15 0934 07/12/15 1040   07/12/15 0945  vancomycin (VANCOCIN) IVPB 1000 mg/200 mL premix     1,000 mg 200 mL/hr over 60 Minutes Intravenous  Once 07/12/15 0934 07/12/15 1155        Objective:   Filed Vitals:   07/14/15 2035 07/15/15 0501 07/15/15 1415 07/15/15 2201  BP: 111/51 137/55 146/57 144/53  Pulse: 63 67 43 41  Temp: 97.6 F (36.4 C) 97.4 F (36.3 C) 98.2 F (36.8 C)   TempSrc: Axillary Axillary Axillary   Resp: 17 18 16    Height:      Weight:      SpO2: 98% 95% 100%     Wt Readings from Last 3 Encounters:  07/12/15 95.255 kg (210 lb)  05/06/15 95.255 kg (210 lb)     Intake/Output Summary (Last 24 hours) at 07/16/15 1230 Last data filed at 07/16/15 1204  Gross per 24 hour  Intake    120 ml  Output   1275 ml  Net  -1155 ml     Physical Exam  Awake , confused, No new F.N deficits, Normal affect [place].AT,PERRAL Supple Neck,No JVD, No cervical lymphadenopathy appriciated.  Symmetrical Chest wall movement, Good air movement bilaterally, CTAB RRR,No Gallops,Rubs or new Murmurs, No Parasternal Heave +ve B.Sounds, Abd Soft, No tenderness, No organomegaly appriciated, No rebound - guarding or rigidity.  No Cyanosis, Clubbing or edema, No new Rash or bruise     Data Review:    CBC  Recent  Labs Lab 07/12/15 1009 07/13/15 0144 07/14/15 0530 07/15/15 0042 07/16/15 0544  WBC 15.4* 7.7 8.2 7.0 6.8  HGB 13.7 11.3* 12.3* 11.5* 11.4*  HCT 42.3 35.3* 37.7* 35.4* 34.3*  PLT 134* 96* 117* 128* 125*  MCV 94.0 94.6 94.0 93.2 92.0  MCH 30.4 30.3 30.7 30.3 30.6  MCHC 32.4 32.0 32.6 32.5 33.2  RDW 14.0 14.0 14.1 14.0 13.7    Chemistries   Recent Labs Lab 07/12/15 1005  07/14/15 1706 07/15/15 0042 07/15/15 0921 07/15/15 1825 07/16/15 0544  NA 156*  < > 157* 158* 158* 155* 151*  K 4.4  < > 3.4* 4.3 3.7 4.0 3.1*  CL 115*  < > 129* 128* 129* 125* 120*  CO2 22  < > 19* 23 23 23 23   GLUCOSE 142*  < > 143* 147* 136* 124* 120*  BUN 133*  < > 79* 70* 59* 49* 41*  CREATININE 8.50*  < > 3.04* 2.88* 2.49* 2.42* 2.04*  CALCIUM 9.2  < > 8.3* 8.5* 8.5* 8.3* 8.2*  AST 92*  --   --   --   --   --   --   ALT 60  --   --   --   --   --   --   ALKPHOS 95  --   --   --   --   --   --   BILITOT 0.5  --   --   --   --   --   --   < > = values in this interval not displayed. ------------------------------------------------------------------------------------------------------------------ No results for input(s): CHOL, HDL, LDLCALC, TRIG, CHOLHDL, LDLDIRECT in the last 72 hours.  No results found for: HGBA1C ------------------------------------------------------------------------------------------------------------------ No results for input(s): TSH, T4TOTAL, T3FREE, THYROIDAB in the last 72 hours.  Invalid input(s): FREET3 ------------------------------------------------------------------------------------------------------------------ No results for input(s): VITAMINB12, FOLATE, FERRITIN, TIBC, IRON, RETICCTPCT in the last 72 hours.  Coagulation profile  Recent Labs Lab 07/12/15 1605  INR 1.57*    No results for input(s): DDIMER in the last 72 hours.  Cardiac Enzymes  Recent Labs Lab 07/12/15 1605 07/12/15 2202 07/13/15 0144  TROPONINI 0.08* 0.06* 0.06*    ------------------------------------------------------------------------------------------------------------------ No results found for: BNP  Inpatient Medications  Scheduled Meds: . antiseptic oral rinse  7 mL Mouth Rinse q12n4p  . aspirin  300 mg Rectal Daily  . chlorhexidine  15 mL Mouth Rinse BID  . fluticasone  2 spray Each Nare Daily  . heparin  5,000 Units Subcutaneous 3 times per day  . piperacillin-tazobactam (ZOSYN)  IV  3.375 g Intravenous Q8H  . potassium chloride  10 mEq Intravenous Q1 Hr x 4   Continuous Infusions: . dextrose 120 mL/hr at 07/16/15 0849   PRN Meds:.acetaminophen **OR** [DISCONTINUED] acetaminophen, bisacodyl  Micro Results Recent Results (from the past 240 hour(s))  Blood Culture (routine x 2)     Status: None (Preliminary result)   Collection Time: 07/12/15 10:00 AM  Result Value Ref Range Status   Specimen Description BLOOD RIGHT ANTECUBITAL  Final   Special Requests BOTTLES DRAWN AEROBIC AND ANAEROBIC 5ML  Final   Culture   Final    NO GROWTH 3 DAYS Performed at [facility]    Report Status PENDING  Incomplete  Blood Culture (routine x 2)     Status: None (Preliminary result)   Collection Time:  07/12/15 10:06 AM  Result Value Ref Range Status   Specimen Description BLOOD RIGHT HAND  Final   Special Requests BOTTLES DRAWN AEROBIC ONLY 5ML  Final   Culture   Final    NO GROWTH 3 DAYS Performed at [facility]    Report Status PENDING  Incomplete  Urine culture     Status: None   Collection Time: 07/12/15 10:51 AM  Result Value Ref Range Status   Specimen Description URINE, CATHETERIZED  Final   Special Requests NONE  Final   Culture   Final    NO GROWTH 1 DAY Performed at [facility]    Report Status 07/13/2015 FINAL  Final  MRSA PCR Screening     Status: None   Collection Time: 07/12/15  8:05 PM  Result Value Ref Range Status   MRSA by PCR NEGATIVE NEGATIVE Final    Comment:        The GeneXpert MRSA  Assay (FDA approved for NASAL specimens only), is one component of a comprehensive MRSA colonization surveillance program. It is not intended to diagnose MRSA infection nor to guide or monitor treatment for MRSA infections.     Radiology Reports Ct Head Wo Contrast  07/12/2015  CLINICAL DATA:  Dementia, bladder cancer, altered mental status EXAM: CT HEAD WITHOUT CONTRAST TECHNIQUE: Contiguous axial images were obtained from the base of the skull through the vertex without intravenous contrast. COMPARISON:  05/06/2015 FINDINGS: Brain: No intracranial hemorrhage, mass effect or midline shift. No acute cortical infarction. No mass lesion is noted on this unenhanced scan. Moderate cerebral atrophy again noted. Stable chronic white matter disease. Vascular: Atherosclerotic calcifications of carotid siphon. Skull: Negative for fracture or focal lesion. Sinuses/Orbits: No acute findings. Other: None. IMPRESSION: No acute intracranial abnormality. Moderate cerebral atrophy again noted. Stable periventricular and patchy subcortical chronic white matter disease. No definite acute cortical infarction. Ventricular size is stable from prior exam. Electronically Signed   By: Lahoma Crocker M.D.   On: 07/12/2015 10:46   Dg Chest Port 1 View  07/15/2015  CLINICAL DATA:  Pneumonia. EXAM: PORTABLE CHEST 1 VIEW COMPARISON:  07/12/2015 . FINDINGS: Mediastinum and hilar structures normal. Left lower lobe infiltrate noted consistent pneumonia. Mild right upper lobe and right base infiltrate. These findings are new. Small left pleural effusion cannot be excluded. No pneumothorax. Interposition of bowel under the left hemidiaphragm again noted . IMPRESSION: Prominent left lower lobe infiltrate. Mild right upper lobe and right lower lobe infiltrate. These findings are new from prior exam . Aspiration cannot be excluded. Small left pleural effusion cannot be excluded. Electronically Signed   By: Marcello Moores  Register   On: 07/15/2015  07:24   Dg Chest Port 1 View  07/12/2015  CLINICAL DATA:  Sepsis EXAM: PORTABLE CHEST 1 VIEW COMPARISON:  05/07/2015 FINDINGS: Cardiac shadow is within normal limits. Elevation of left hemidiaphragm is noted and stable. Some mild increased density is noted in the right upper lobe which may represent early infiltrate. No bony abnormality is seen. IMPRESSION: Likely early infiltrate in the right upper lobe. Electronically Signed   By: Inez Catalina M.D.   On: 07/12/2015 14:35    Time Spent in minutes  30   SINGH,PRASHANT K M.D on 07/16/2015 at 12:30 PM  Between 7am to 7pm - Pager - [phone]  After 7pm go to [email or web address] - password [facility]  [facility] -  Office  [phone]

## 2015-07-17 LAB — BASIC METABOLIC PANEL
Anion gap: 8 (ref 5–15)
BUN: 33 mg/dL — ABNORMAL HIGH (ref 6–20)
CALCIUM: 8.1 mg/dL — AB (ref 8.9–10.3)
CO2: 23 mmol/L (ref 22–32)
CREATININE: 1.72 mg/dL — AB (ref 0.61–1.24)
Chloride: 118 mmol/L — ABNORMAL HIGH (ref 101–111)
GFR calc Af Amer: 44 mL/min — ABNORMAL LOW (ref >60)
GFR calc non Af Amer: 38 mL/min — ABNORMAL LOW (ref >60)
GLUCOSE: 125 mg/dL — AB (ref 65–99)
Potassium: 3.3 mmol/L — ABNORMAL LOW (ref 3.5–5.1)
Sodium: 149 mmol/L — ABNORMAL HIGH (ref 135–145)

## 2015-07-17 LAB — CULTURE, BLOOD (ROUTINE X 2)
CULTURE: NO GROWTH
Culture: NO GROWTH

## 2015-07-17 LAB — C DIFFICILE QUICK SCREEN W PCR REFLEX
C Diff antigen: NEGATIVE
C Diff interpretation: NEGATIVE
C Diff toxin: NEGATIVE

## 2015-07-17 MED ORDER — POTASSIUM CL IN DEXTROSE 5% 20 MEQ/L IV SOLN
20.0000 meq | INTRAVENOUS | Status: DC
  Administered 2015-07-17 – 2015-07-20 (×6): 20 meq via INTRAVENOUS
  Filled 2015-07-17 (×10): qty 1000

## 2015-07-17 MED ORDER — MEMANTINE HCL 10 MG PO TABS
10.0000 mg | ORAL_TABLET | Freq: Two times a day (BID) | ORAL | Status: DC
  Administered 2015-07-17 – 2015-07-20 (×6): 10 mg via ORAL
  Filled 2015-07-17 (×9): qty 1

## 2015-07-17 MED ORDER — MIRABEGRON ER 50 MG PO TB24
50.0000 mg | ORAL_TABLET | Freq: Every morning | ORAL | Status: DC
  Administered 2015-07-18 – 2015-07-20 (×3): 50 mg via ORAL
  Filled 2015-07-17 (×4): qty 1

## 2015-07-17 MED ORDER — POTASSIUM CHLORIDE 10 MEQ/100ML IV SOLN
10.0000 meq | INTRAVENOUS | Status: AC
  Administered 2015-07-17 (×2): 10 meq via INTRAVENOUS
  Filled 2015-07-17 (×2): qty 100

## 2015-07-17 MED ORDER — CARBAMAZEPINE 100 MG PO CHEW
100.0000 mg | CHEWABLE_TABLET | Freq: Three times a day (TID) | ORAL | Status: DC
  Administered 2015-07-17 – 2015-07-20 (×10): 100 mg via ORAL
  Filled 2015-07-17 (×13): qty 1

## 2015-07-17 MED ORDER — DONEPEZIL HCL 10 MG PO TABS
10.0000 mg | ORAL_TABLET | Freq: Every day | ORAL | Status: DC
  Administered 2015-07-17 – 2015-07-19 (×3): 10 mg via ORAL
  Filled 2015-07-17 (×5): qty 1

## 2015-07-17 MED ORDER — MONTELUKAST SODIUM 10 MG PO TABS
10.0000 mg | ORAL_TABLET | Freq: Every day | ORAL | Status: DC
  Administered 2015-07-18 – 2015-07-20 (×3): 10 mg via ORAL
  Filled 2015-07-17 (×5): qty 1

## 2015-07-17 MED ORDER — TAMSULOSIN HCL 0.4 MG PO CAPS
0.4000 mg | ORAL_CAPSULE | Freq: Every day | ORAL | Status: DC
  Administered 2015-07-18 – 2015-07-20 (×3): 0.4 mg via ORAL
  Filled 2015-07-17 (×4): qty 1

## 2015-07-17 MED ORDER — ZIPRASIDONE HCL 20 MG PO CAPS
20.0000 mg | ORAL_CAPSULE | Freq: Two times a day (BID) | ORAL | Status: DC | PRN
  Administered 2015-07-17: 20 mg via ORAL
  Filled 2015-07-17 (×2): qty 1

## 2015-07-17 MED ORDER — ESCITALOPRAM OXALATE 20 MG PO TABS
20.0000 mg | ORAL_TABLET | Freq: Every day | ORAL | Status: DC
  Administered 2015-07-17 – 2015-07-20 (×4): 20 mg via ORAL
  Filled 2015-07-17 (×5): qty 1

## 2015-07-17 MED ORDER — AMOXICILLIN-POT CLAVULANATE 875-125 MG PO TABS
1.0000 | ORAL_TABLET | Freq: Two times a day (BID) | ORAL | Status: DC
  Administered 2015-07-17 – 2015-07-20 (×6): 1 via ORAL
  Filled 2015-07-17 (×9): qty 1

## 2015-07-17 MED ORDER — TRAZODONE HCL 50 MG PO TABS
50.0000 mg | ORAL_TABLET | Freq: Every evening | ORAL | Status: DC | PRN
  Administered 2015-07-17 – 2015-07-19 (×3): 50 mg via ORAL
  Filled 2015-07-17 (×3): qty 1

## 2015-07-17 NOTE — Progress Notes (Signed)
Pharmacy Antibiotic Note  Roberto Campbell is a 75 y.o. male admitted on 07/12/2015 with sepsis.  He resides at a nursing facility and was brought to ED with altered mental status.  Pharmacy consulted for Vancomycin & Zosyn dosing.  Sources potentially UTI vs HCAP +/- aspiration. Severe AKI- improving with hydration. (baseline SCr ~ 1.3 in Jan 2017).Vancomycin dc'd 3/30.  Plan: Continue Zosyn to 3.375g IV q8h (4 hour infusion time)  Plan transition to PO augmentin when safe for POs   Height: 6' (182.9 cm) Weight: 210 lb (95.255 kg) IBW/kg (Calculated) : 77.6  Temp (24hrs), Avg:98.2 F (36.8 C), Min:98 F (36.7 C), Max:98.4 F (36.9 C)   Recent Labs Lab 07/12/15 1009 07/12/15 1019 07/12/15 1606 07/12/15 1921 07/13/15 0144 07/14/15 0530 07/14/15 1007  07/15/15 0042 07/15/15 0921 07/15/15 1825 07/16/15 0544 07/17/15 0520  WBC 15.4*  --   --   --  7.7 8.2  --   --  7.0  --   --  6.8  --   CREATININE  --   --   --   --  6.22* 4.04*  --   < > 2.88* 2.49* 2.42* 2.04* 1.72*  LATICACIDVEN  --  2.14* 1.5 1.1  --   --   --   --   --   --   --   --   --   VANCORANDOM  --   --   --   --   --   --  13  --   --   --   --   --   --   < > = values in this interval not displayed.  Estimated Creatinine Clearance: 45.8 mL/min (by C-G formula based on Cr of 1.72).    Allergies  Allergen Reactions  . Morphine And Related Swelling    Other reaction(s): Hallucinations  . Alprazolam     Other reaction(s): Other (See Comments) Nightmares  . Atorvastatin Other (See Comments)    Myalgias.   . Meperidine     Other reaction(s): Other (See Comments) Hallucinations    Antimicrobials this admission: Vancomycin 3/28 >> 3/30 Zosyn 3/28 >>   Dose adjustments this admission: 3/30 Increase Zosyn to extended interval dosing of 3.375g IV q8h (4 hour infusion time) since CrCl now >20 ml/min.  Microbiology results: 3/28 BCx: ngtd  3/28 UCx: NGF 3/28 MRSA PCR neg 3/29 strept pneumo ur ag:  neg C.diff PCR: ordered  Thank you for allowing pharmacy to be a part of this patient's care.  Peggyann Juba, PharmD, BCPS Pager: [phone]  07/17/2015 11:05 AM

## 2015-07-17 NOTE — Progress Notes (Signed)
TRH Progress Note                                                                                                                                                                                                                      Patient Demographics:    Roberto Campbell, is a 75 y.o. male, DOB - 22-Dec-1940, [id number]  Admit date - 07/12/2015   Admitting Physician Venetia Maxon Rama, MD  Outpatient Primary MD for the patient is No primary care provider on file.  LOS - 5  Outpatient Specialists:   Chief Complaint  Patient presents with  . Altered Mental Status    Summary  75 year old male with a PMH of dementia, bladder cancer, HTN, chronic kidney disease, a resident of SNF, sent to the ED on 07/12/15 with two-week history of gradual decline in his usual condition, not been eating or drinking well or getting out of bed. In the ED he was nonverbal and unable to provide any history. Initial evaluation showed markedly elevated creatinine and hypotension concerning for sepsis etiology but blood pressure responded to IV fluid challenge. Admitted for sepsis, acute kidney injury and hypernatremia.      Subjective:   No complaints   Assessment  & Plan :     1. Sepsis due to Pneumonia along with likely UTI. Sepsis physiology has resolved with hydration and empiric antibiotics, initially treated with IV vancomycin and Zosyn, vancomycin stopped on 06/27/2015. There is question of possible aspiration due to underlying dementia. speech therapy is following and currently on dysphagia 1 diet. Clinically improved. All cultures negative. Change to augmentin  2. Severe dehydration with ARF and hypernatremia. Na, bun and creat still high, and clinically dry. Continue hypotonic IVF  3. Sacral pressure sore. Only see nursing note for details. Continue supportive care.  4. Prolonged QTC  on admission. Resolved.   5. Asymptomatic bradycardia. Per family, chronic. Will d/c tele  6. Sepsis related thrombocytopenia. Platelet count much improved. Continue treatment for underlying cause.  7. Mild troponin rise in non-ACS pattern due to demand ischemia from sepsis. EKG nonacute. No chest pain. On aspirin continue.  Hypokalemia: replete.  Dysphagia: see above  Dementia: family reports recent decrease in functional status. Will get PT eval, check B12, folate  Resume some home meds. See orders   Code Status : DNR  Family Communication  : family at bedside  Disposition Plan  : SNF 1-2 days  Consults  :  Speech  Procedures  :   DVT  Prophylaxis  :    Heparin    Lab Results  Component Value Date   PLT 125* 07/16/2015    Antibiotics  :    Anti-infectives    Start     Dose/Rate Route Frequency Ordered Stop   07/17/15 2200  amoxicillin-clavulanate (AUGMENTIN) 875-125 MG per tablet 1 tablet     1 tablet Oral Every 12 hours 07/17/15 1619     07/14/15 1800  piperacillin-tazobactam (ZOSYN) IVPB 3.375 g  Status:  Discontinued     3.375 g 12.5 mL/hr over 240 Minutes Intravenous Every 8 hours 07/14/15 1134 07/17/15 1619   07/14/15 1200  vancomycin (VANCOCIN) IVPB 1000 mg/200 mL premix     1,000 mg 200 mL/hr over 60 Minutes Intravenous  Once 07/14/15 1124 07/14/15 1247   07/12/15 1800  piperacillin-tazobactam (ZOSYN) IVPB 2.25 g  Status:  Discontinued     2.25 g 100 mL/hr over 30 Minutes Intravenous Every 8 hours 07/12/15 1141 07/12/15 1155   07/12/15 1800  piperacillin-tazobactam (ZOSYN) IVPB 2.25 g  Status:  Discontinued     2.25 g 100 mL/hr over 30 Minutes Intravenous Every 8 hours 07/12/15 1315 07/14/15 1134   07/12/15 1415  piperacillin-tazobactam (ZOSYN) IVPB 3.375 g  Status:  Discontinued     3.375 g 100 mL/hr over 30 Minutes Intravenous  Once 07/12/15 1405 07/12/15 1425   07/12/15 1415  vancomycin (VANCOCIN) IVPB 1000 mg/200 mL premix  Status:  Discontinued      1,000 mg 200 mL/hr over 60 Minutes Intravenous  Once 07/12/15 1405 07/12/15 1425   07/12/15 1330  vancomycin (VANCOCIN) IVPB 1000 mg/200 mL premix     1,000 mg 200 mL/hr over 60 Minutes Intravenous NOW 07/12/15 1315 07/12/15 1616   07/12/15 1145  vancomycin (VANCOCIN) IVPB 1000 mg/200 mL premix  Status:  Discontinued     1,000 mg 200 mL/hr over 60 Minutes Intravenous NOW 07/12/15 1141 07/12/15 1155   07/12/15 0945  piperacillin-tazobactam (ZOSYN) IVPB 3.375 g     3.375 g 100 mL/hr over 30 Minutes Intravenous  Once 07/12/15 0934 07/12/15 1040   07/12/15 0945  vancomycin (VANCOCIN) IVPB 1000 mg/200 mL premix     1,000 mg 200 mL/hr over 60 Minutes Intravenous  Once 07/12/15 0934 07/12/15 1155        Objective:   Filed Vitals:   07/16/15 2114 07/17/15 0636 07/17/15 1300 07/17/15 1500  BP: 100/70 126/49 139/63 108/85  Pulse: 53 50 73 46  Temp: 98.3 F (36.8 C) 98 F (36.7 C) 97.4 F (36.3 C)   TempSrc: Oral  Oral   Resp:  18 20 18   Height:      Weight:      SpO2: 100% 97% 95% 100%    Wt Readings from Last 3 Encounters:  07/12/15 95.255 kg (210 lb)  05/06/15 95.255 kg (210 lb)     Intake/Output Summary (Last 24 hours) at 07/17/15 1628 Last data filed at 07/17/15 0840  Gross per 24 hour  Intake    250 ml  Output      0 ml  Net    250 ml     Physical Exam  Awake , cooperative. Wont answer most questions, but follows commands HEENT: dry MM Supple Neck,No JVD, No cervical lymphadenopathy appriciated.  Symmetrical Chest wall movement, Good air movement bilaterally, CTAB RRR,No Gallops,Rubs or new Murmurs, No Parasternal Heave +ve B.Sounds, Abd Soft, No tenderness, No organomegaly appriciated, No rebound - guarding or rigidity. No Cyanosis, Clubbing or edema, No new  Rash or bruise     Data Review:    CBC  Recent Labs Lab 07/12/15 1009 07/13/15 0144 07/14/15 0530 07/15/15 0042 07/16/15 0544  WBC 15.4* 7.7 8.2 7.0 6.8  HGB 13.7 11.3* 12.3* 11.5* 11.4*  HCT  42.3 35.3* 37.7* 35.4* 34.3*  PLT 134* 96* 117* 128* 125*  MCV 94.0 94.6 94.0 93.2 92.0  MCH 30.4 30.3 30.7 30.3 30.6  MCHC 32.4 32.0 32.6 32.5 33.2  RDW 14.0 14.0 14.1 14.0 13.7    Chemistries   Recent Labs Lab 07/12/15 1005  07/15/15 0042 07/15/15 0921 07/15/15 1825 07/16/15 0544 07/17/15 0520  NA 156*  < > 158* 158* 155* 151* 149*  K 4.4  < > 4.3 3.7 4.0 3.1* 3.3*  CL 115*  < > 128* 129* 125* 120* 118*  CO2 22  < > 23 23 23 23 23   GLUCOSE 142*  < > 147* 136* 124* 120* 125*  BUN 133*  < > 70* 59* 49* 41* 33*  CREATININE 8.50*  < > 2.88* 2.49* 2.42* 2.04* 1.72*  CALCIUM 9.2  < > 8.5* 8.5* 8.3* 8.2* 8.1*  AST 92*  --   --   --   --   --   --   ALT 60  --   --   --   --   --   --   ALKPHOS 95  --   --   --   --   --   --   BILITOT 0.5  --   --   --   --   --   --   < > = values in this interval not displayed. ------------------------------------------------------------------------------------------------------------------ No results for input(s): CHOL, HDL, LDLCALC, TRIG, CHOLHDL, LDLDIRECT in the last 72 hours.  No results found for: HGBA1C ------------------------------------------------------------------------------------------------------------------ No results for input(s): TSH, T4TOTAL, T3FREE, THYROIDAB in the last 72 hours.  Invalid input(s): FREET3 ------------------------------------------------------------------------------------------------------------------ No results for input(s): VITAMINB12, FOLATE, FERRITIN, TIBC, IRON, RETICCTPCT in the last 72 hours.  Coagulation profile  Recent Labs Lab 07/12/15 1605  INR 1.57*    No results for input(s): DDIMER in the last 72 hours.  Cardiac Enzymes  Recent Labs Lab 07/12/15 1605 07/12/15 2202 07/13/15 0144  TROPONINI 0.08* 0.06* 0.06*   ------------------------------------------------------------------------------------------------------------------ No results found for: BNP  Inpatient  Medications  Scheduled Meds: . amoxicillin-clavulanate  1 tablet Oral Q12H  . antiseptic oral rinse  7 mL Mouth Rinse q12n4p  . carbamazepine  100 mg Oral TID  . chlorhexidine  15 mL Mouth Rinse BID  . donepezil  10 mg Oral QHS  . escitalopram  20 mg Oral Daily  . fluticasone  2 spray Each Nare Daily  . heparin  5,000 Units Subcutaneous 3 times per day  . memantine  10 mg Oral BID  . [START ON 07/18/2015] mirabegron ER  50 mg Oral q morning - 10a  . [START ON 07/18/2015] montelukast  10 mg Oral Q breakfast  . potassium chloride  10 mEq Intravenous Q1 Hr x 2  . [START ON 07/18/2015] tamsulosin  0.4 mg Oral QPC breakfast   Continuous Infusions: . dextrose 5 % with KCl 20 mEq / L     PRN Meds:.acetaminophen **OR** [DISCONTINUED] acetaminophen, bisacodyl, traZODone, ziprasidone  Micro Results Recent Results (from the past 240 hour(s))  Blood Culture (routine x 2)     Status: None   Collection Time: 07/12/15 10:00 AM  Result Value Ref Range Status   Specimen Description BLOOD RIGHT ANTECUBITAL  Final   Special Requests BOTTLES DRAWN AEROBIC AND ANAEROBIC 5ML  Final   Culture   Final    NO GROWTH 5 DAYS Performed at [facility]    Report Status 07/17/2015 FINAL  Final  Blood Culture (routine x 2)     Status: None   Collection Time: 07/12/15 10:06 AM  Result Value Ref Range Status   Specimen Description BLOOD RIGHT HAND  Final   Special Requests BOTTLES DRAWN AEROBIC ONLY 5ML  Final   Culture   Final    NO GROWTH 5 DAYS Performed at [facility]    Report Status 07/17/2015 FINAL  Final  Urine culture     Status: None   Collection Time: 07/12/15 10:51 AM  Result Value Ref Range Status   Specimen Description URINE, CATHETERIZED  Final   Special Requests NONE  Final   Culture   Final    NO GROWTH 1 DAY Performed at [facility]    Report Status 07/13/2015 FINAL  Final  MRSA PCR Screening     Status: None   Collection Time: 07/12/15  8:05 PM  Result Value  Ref Range Status   MRSA by PCR NEGATIVE NEGATIVE Final    Comment:        The GeneXpert MRSA Assay (FDA approved for NASAL specimens only), is one component of a comprehensive MRSA colonization surveillance program. It is not intended to diagnose MRSA infection nor to guide or monitor treatment for MRSA infections.   C difficile quick scan w PCR reflex     Status: None   Collection Time: 07/17/15 11:32 AM  Result Value Ref Range Status   C Diff antigen NEGATIVE NEGATIVE Final   C Diff toxin NEGATIVE NEGATIVE Final   C Diff interpretation Negative for toxigenic C. difficile  Final    Radiology Reports Ct Head Wo Contrast  07/12/2015  CLINICAL DATA:  Dementia, bladder cancer, altered mental status EXAM: CT HEAD WITHOUT CONTRAST TECHNIQUE: Contiguous axial images were obtained from the base of the skull through the vertex without intravenous contrast. COMPARISON:  05/06/2015 FINDINGS: Brain: No intracranial hemorrhage, mass effect or midline shift. No acute cortical infarction. No mass lesion is noted on this unenhanced scan. Moderate cerebral atrophy again noted. Stable chronic white matter disease. Vascular: Atherosclerotic calcifications of carotid siphon. Skull: Negative for fracture or focal lesion. Sinuses/Orbits: No acute findings. Other: None. IMPRESSION: No acute intracranial abnormality. Moderate cerebral atrophy again noted. Stable periventricular and patchy subcortical chronic white matter disease. No definite acute cortical infarction. Ventricular size is stable from prior exam. Electronically Signed   By: Lahoma Crocker M.D.   On: 07/12/2015 10:46   Dg Chest Port 1 View  07/15/2015  CLINICAL DATA:  Pneumonia. EXAM: PORTABLE CHEST 1 VIEW COMPARISON:  07/12/2015 . FINDINGS: Mediastinum and hilar structures normal. Left lower lobe infiltrate noted consistent pneumonia. Mild right upper lobe and right base infiltrate. These findings are new. Small left pleural effusion cannot be  excluded. No pneumothorax. Interposition of bowel under the left hemidiaphragm again noted . IMPRESSION: Prominent left lower lobe infiltrate. Mild right upper lobe and right lower lobe infiltrate. These findings are new from prior exam . Aspiration cannot be excluded. Small left pleural effusion cannot be excluded. Electronically Signed   By: Marcello Moores  Register   On: 07/15/2015 07:24   Dg Chest Port 1 View  07/12/2015  CLINICAL DATA:  Sepsis EXAM: PORTABLE CHEST 1 VIEW COMPARISON:  05/07/2015 FINDINGS: Cardiac shadow is within normal  limits. Elevation of left hemidiaphragm is noted and stable. Some mild increased density is noted in the right upper lobe which may represent early infiltrate. No bony abnormality is seen. IMPRESSION: Likely early infiltrate in the right upper lobe. Electronically Signed   By: Inez Catalina M.D.   On: 07/12/2015 14:35   Dg Swallowing Func-speech Pathology  07/16/2015  Objective Swallowing Evaluation: Type of Study: Bedside Swallow Evaluation Patient Details Name: Shone Cecere MRN: [id number] Date of Birth: 28-Apr-1940 Today's Date: 07/16/2015 Time: SLP Start Time (ACUTE ONLY): 1305-SLP Stop Time (ACUTE ONLY): 1340 SLP Time Calculation (min) (ACUTE ONLY): 35 min Past Medical History: Past Medical History Diagnosis Date . Dementia  . Bladder cancer ([place])  . HTN (hypertension)  . CKD (chronic kidney disease)  Past Surgical History: Past Surgical History Procedure Laterality Date . Back surgery   . Minor hemorrhoidectomy   . Cholecystectomy   HPI: 75 year old male admitted 07/12/15 due to AMS, decline over the past few weeks. PMH signiciant for bladder CA, dementia.  07/15/15 CXR could not rule out aspiration pna.  BSE completed with recommendations for dys1/nectar and MBS.   Subjective: Pt resting in bed Assessment / Plan / Recommendation CHL IP CLINICAL IMPRESSIONS 07/16/2015 Therapy Diagnosis Moderate oral phase dysphagia;Moderate pharyngeal phase dysphagia Clinical Impression Moderate  oropharyngeal sensormotor dysphagia characterized by delayed oral transiting and premature loss of boluses into pharynx due to poor lingual control.  Pharyngeal swallow was delayed to pyriform sinus with liquids and weakness resulted in residauls *worse with increased viscocity.  Pt did not dry swallow on command to faciliate clearance.  There was not significant aspiration but pt is at risk due to weakness and inability to follow directions.  Liquid consumption faciliated pharyngeal clearance of pudding/cracker.  At this time, due to pt's weakness, questionable aspiration, recommend continue dys1/nectar diet with strict precautions.  Skilled intervention included determining which compensation strategies were effective for pt.  SLP to follow for family education.   Impact on safety and function Moderate aspiration risk   CHL IP TREATMENT RECOMMENDATION 07/16/2015 Treatment Recommendations Therapy as outlined in treatment plan below   Prognosis 07/16/2015 Prognosis for Safe Diet Advancement Fair Barriers to Reach Goals Cognitive deficits Barriers/Prognosis Comment -- CHL IP DIET RECOMMENDATION 07/16/2015 SLP Diet Recommendations Nectar thick liquid;Dysphagia 1 (Puree) solids Liquid Administration via Cup Medication Administration Crushed with puree Compensations Slow rate;Small sips/bites;Follow solids with liquid Postural Changes Remain semi-upright after after feeds/meals (Comment)   CHL IP OTHER RECOMMENDATIONS 07/16/2015 Recommended Consults -- Oral Care Recommendations Oral care BID Other Recommendations Order thickener from pharmacy   CHL IP FOLLOW UP RECOMMENDATIONS 07/16/2015 Follow up Recommendations Home health SLP   CHL IP FREQUENCY AND DURATION 07/16/2015 Speech Therapy Frequency (ACUTE ONLY) min 2x/week Treatment Duration 1 week      CHL IP ORAL PHASE 07/16/2015 Oral Phase Impaired Oral - Pudding Teaspoon -- Oral - Pudding Cup -- Oral - Honey Teaspoon -- Oral - Honey Cup -- Oral - Nectar Teaspoon -- Oral - Nectar Cup  Weak lingual manipulation;Lingual pumping;Reduced posterior propulsion;Premature spillage;Delayed oral transit Oral - Nectar Straw Weak lingual manipulation;Lingual pumping;Premature spillage;Delayed oral transit;Reduced posterior propulsion Oral - Thin Teaspoon Weak lingual manipulation;Lingual pumping;Reduced posterior propulsion;Premature spillage Oral - Thin Cup Weak lingual manipulation;Lingual pumping;Reduced posterior propulsion;Premature spillage Oral - Thin Straw Weak lingual manipulation;Lingual pumping;Reduced posterior propulsion;Delayed oral transit;Premature spillage Oral - Puree Weak lingual manipulation;Lingual pumping;Premature spillage;Delayed oral transit;Reduced posterior propulsion Oral - Mech Soft -- Oral - Regular Weak lingual manipulation;Lingual pumping;Delayed oral  transit;Impaired mastication;Reduced posterior propulsion;Premature spillage Oral - Multi-Consistency -- Oral - Pill -- Oral Phase - Comment --  CHL IP PHARYNGEAL PHASE 07/16/2015 Pharyngeal Phase Impaired Pharyngeal- Pudding Teaspoon -- Pharyngeal -- Pharyngeal- Pudding Cup -- Pharyngeal -- Pharyngeal- Honey Teaspoon -- Pharyngeal -- Pharyngeal- Honey Cup -- Pharyngeal -- Pharyngeal- Nectar Teaspoon Delayed swallow initiation-pyriform sinuses;Reduced pharyngeal peristalsis;Reduced epiglottic inversion;Pharyngeal residue - valleculae;Pharyngeal residue - pyriform Pharyngeal -- Pharyngeal- Nectar Cup Delayed swallow initiation-pyriform sinuses;Reduced pharyngeal peristalsis;Reduced epiglottic inversion;Pharyngeal residue - valleculae;Pharyngeal residue - pyriform;Penetration/Aspiration during swallow Pharyngeal Material enters airway, CONTACTS cords and not ejected out Pharyngeal- Nectar Straw Delayed swallow initiation-pyriform sinuses;Reduced pharyngeal peristalsis;Reduced epiglottic inversion;Pharyngeal residue - valleculae;Pharyngeal residue - pyriform;Penetration/Aspiration during swallow Pharyngeal Material enters airway,  CONTACTS cords and not ejected out;Material enters airway, CONTACTS cords and then ejected out Pharyngeal- Thin Teaspoon Delayed swallow initiation-pyriform sinuses;Reduced pharyngeal peristalsis;Reduced epiglottic inversion;Pharyngeal residue - valleculae;Pharyngeal residue - pyriform Pharyngeal -- Pharyngeal- Thin Cup Delayed swallow initiation-pyriform sinuses;Reduced pharyngeal peristalsis;Reduced epiglottic inversion;Pharyngeal residue - valleculae;Pharyngeal residue - pyriform;Penetration/Aspiration during swallow Pharyngeal Material enters airway, CONTACTS cords and not ejected out Pharyngeal- Thin Straw Delayed swallow initiation-pyriform sinuses;Reduced pharyngeal peristalsis;Reduced epiglottic inversion;Pharyngeal residue - valleculae;Pharyngeal residue - pyriform Pharyngeal -- Pharyngeal- Puree Delayed swallow initiation-vallecula;Reduced pharyngeal peristalsis;Reduced epiglottic inversion;Pharyngeal residue - valleculae Pharyngeal -- Pharyngeal- Mechanical Soft Delayed swallow initiation-vallecula;Reduced pharyngeal peristalsis;Reduced epiglottic inversion;Pharyngeal residue - valleculae Pharyngeal -- Pharyngeal- Regular -- Pharyngeal -- Pharyngeal- Multi-consistency -- Pharyngeal -- Pharyngeal- Pill -- Pharyngeal -- Pharyngeal Comment pt did not dry swallow on command nor cough likely due to his mentation  CHL IP CERVICAL ESOPHAGEAL PHASE 07/16/2015 Cervical Esophageal Phase Impaired Pudding Teaspoon -- Pudding Cup -- Honey Teaspoon -- Honey Cup -- Nectar Teaspoon -- Nectar Cup -- Nectar Straw -- Thin Teaspoon -- Thin Cup -- Thin Straw -- Puree -- Mechanical Soft -- Regular -- Multi-consistency -- Pill -- Cervical Esophageal Comment accumulation of barium at pyriform sinus mixed with secretions, esophageal sweep revealed clear distal esophagus  - radiologist not present to confirm No flowsheet data found. Janett Labella [place], [facility] SLP [phone]               Time Spent in minutes   30   Delfina Redwood M.D on 07/17/2015 at 4:28 PM   [email or web address] - password [facility]  [facility]

## 2015-07-18 LAB — BASIC METABOLIC PANEL
Anion gap: 6 (ref 5–15)
BUN: 24 mg/dL — AB (ref 6–20)
CHLORIDE: 117 mmol/L — AB (ref 101–111)
CO2: 24 mmol/L (ref 22–32)
Calcium: 8.4 mg/dL — ABNORMAL LOW (ref 8.9–10.3)
Creatinine, Ser: 1.65 mg/dL — ABNORMAL HIGH (ref 0.61–1.24)
GFR calc Af Amer: 46 mL/min — ABNORMAL LOW (ref >60)
GFR calc non Af Amer: 40 mL/min — ABNORMAL LOW (ref >60)
Glucose, Bld: 115 mg/dL — ABNORMAL HIGH (ref 65–99)
POTASSIUM: 3.9 mmol/L (ref 3.5–5.1)
SODIUM: 147 mmol/L — AB (ref 135–145)

## 2015-07-18 LAB — VITAMIN B12: VITAMIN B 12: 522 pg/mL (ref 180–914)

## 2015-07-18 LAB — MAGNESIUM: MAGNESIUM: 1.7 mg/dL (ref 1.7–2.4)

## 2015-07-18 NOTE — Progress Notes (Signed)
TRH Progress Note                                                                                                                                                                                                                      Patient Demographics:    Roberto Campbell, is a 75 y.o. male, DOB - 02/09/1941, [id number]  Admit date - 07/12/2015   Admitting Physician Venetia Maxon Rama, MD  Outpatient Primary MD for the patient is No primary care provider on file.  LOS - 6  Outpatient Specialists:   Chief Complaint  Patient presents with  . Altered Mental Status    Summary  75 year old male with a PMH of dementia, bladder cancer, HTN, chronic kidney disease, a resident of SNF, sent to the ED on 07/12/15 with two-week history of gradual decline in his usual condition, not been eating or drinking well or getting out of bed. In the ED he was nonverbal and unable to provide any history. Initial evaluation showed markedly elevated creatinine and hypotension concerning for sepsis etiology but blood pressure responded to IV fluid challenge. Admitted for sepsis, acute kidney injury and hypernatremia.      Subjective:   Says only a few words- per nursing, swung at her and called her a "bitch"   Assessment  & Plan :     1. Sepsis due to Pneumonia along with likely UTI. Sepsis physiology has resolved with hydration and empiric antibiotics, initially treated with IV vancomycin and Zosyn, vancomycin stopped on 06/27/2015. There is question of possible aspiration due to underlying dementia. speech therapy is following and currently on dysphagia 1 diet. Clinically improved. All cultures negative. Change to augmentin  2. Severe dehydration with ARF and hypernatremia. Na, bun and creat still high, and clinically dry. Continue hypotonic IVF  3. Sacral pressure sore. Only see nursing note  for details. Continue supportive care.  4. Prolonged QTC on admission. Resolved.   5. Asymptomatic bradycardia. - chronic. Will d/c tele  6. Sepsis related thrombocytopenia. Platelet count much improved. Continue treatment for underlying cause.  7. Mild troponin rise in non-ACS pattern due to demand ischemia from sepsis. EKG nonacute. No chest pain. On aspirin continue.  Hypokalemia: replete.  Dysphagia: see above  Dementia:   Resume some home meds. See orders   Code Status : DNR  Family Communication  : family at bedside  Disposition Plan  : SNF once Na stable  Consults  :  Speech  Procedures  :   DVT  Prophylaxis  :    Heparin    Lab Results  Component Value Date   PLT 125* 07/16/2015    Antibiotics  :    Anti-infectives    Start     Dose/Rate Route Frequency Ordered Stop   07/17/15 2200  amoxicillin-clavulanate (AUGMENTIN) 875-125 MG per tablet 1 tablet     1 tablet Oral Every 12 hours 07/17/15 1619     07/14/15 1800  piperacillin-tazobactam (ZOSYN) IVPB 3.375 g  Status:  Discontinued     3.375 g 12.5 mL/hr over 240 Minutes Intravenous Every 8 hours 07/14/15 1134 07/17/15 1619   07/14/15 1200  vancomycin (VANCOCIN) IVPB 1000 mg/200 mL premix     1,000 mg 200 mL/hr over 60 Minutes Intravenous  Once 07/14/15 1124 07/14/15 1247   07/12/15 1800  piperacillin-tazobactam (ZOSYN) IVPB 2.25 g  Status:  Discontinued     2.25 g 100 mL/hr over 30 Minutes Intravenous Every 8 hours 07/12/15 1141 07/12/15 1155   07/12/15 1800  piperacillin-tazobactam (ZOSYN) IVPB 2.25 g  Status:  Discontinued     2.25 g 100 mL/hr over 30 Minutes Intravenous Every 8 hours 07/12/15 1315 07/14/15 1134   07/12/15 1415  piperacillin-tazobactam (ZOSYN) IVPB 3.375 g  Status:  Discontinued     3.375 g 100 mL/hr over 30 Minutes Intravenous  Once 07/12/15 1405 07/12/15 1425   07/12/15 1415  vancomycin (VANCOCIN) IVPB 1000 mg/200 mL premix  Status:  Discontinued     1,000 mg 200 mL/hr over 60  Minutes Intravenous  Once 07/12/15 1405 07/12/15 1425   07/12/15 1330  vancomycin (VANCOCIN) IVPB 1000 mg/200 mL premix     1,000 mg 200 mL/hr over 60 Minutes Intravenous NOW 07/12/15 1315 07/12/15 1616   07/12/15 1145  vancomycin (VANCOCIN) IVPB 1000 mg/200 mL premix  Status:  Discontinued     1,000 mg 200 mL/hr over 60 Minutes Intravenous NOW 07/12/15 1141 07/12/15 1155   07/12/15 0945  piperacillin-tazobactam (ZOSYN) IVPB 3.375 g     3.375 g 100 mL/hr over 30 Minutes Intravenous  Once 07/12/15 0934 07/12/15 1040   07/12/15 0945  vancomycin (VANCOCIN) IVPB 1000 mg/200 mL premix     1,000 mg 200 mL/hr over 60 Minutes Intravenous  Once 07/12/15 0934 07/12/15 1155        Objective:   Filed Vitals:   07/17/15 1300 07/17/15 1500 07/17/15 2152 07/18/15 0602  BP: 139/63 108/85 125/58 134/54  Pulse: 73 46 59 41  Temp: 97.4 F (36.3 C)  97.9 F (36.6 C) 97.2 F (36.2 C)  TempSrc: Oral  Oral Oral  Resp: 20 18 20 16   Height:      Weight:      SpO2: 95% 100%      Wt Readings from Last 3 Encounters:  07/12/15 95.255 kg (210 lb)  05/06/15 95.255 kg (210 lb)     Intake/Output Summary (Last 24 hours) at 07/18/15 1303 Last data filed at 07/18/15 0602  Gross per 24 hour  Intake      0 ml  Output    250 ml  Net   -250 ml     Physical Exam  Awake, Wont answer most questions, but follows commands Symmetrical Chest wall movement, Good air movement bilaterally, CTAB RRR,No Gallops,Rubs or new Murmurs, No Parasternal Heave +ve B.Sounds, Abd Soft, No tenderness, No organomegaly appriciated, No rebound - guarding or rigidity. No Cyanosis, Clubbing or edema, No new Rash or bruise     Data Review:    CBC  Recent Labs Lab 07/12/15 1009 07/13/15 0144 07/14/15 0530 07/15/15 0042 07/16/15 0544  WBC 15.4* 7.7 8.2 7.0 6.8  HGB 13.7 11.3* 12.3* 11.5* 11.4*  HCT 42.3 35.3* 37.7* 35.4* 34.3*  PLT 134* 96* 117* 128* 125*  MCV 94.0 94.6 94.0 93.2 92.0  MCH 30.4 30.3 30.7 30.3 30.6   MCHC 32.4 32.0 32.6 32.5 33.2  RDW 14.0 14.0 14.1 14.0 13.7    Chemistries   Recent Labs Lab 07/12/15 1005  07/15/15 0921 07/15/15 1825 07/16/15 0544 07/17/15 0520 07/18/15 0531  NA 156*  < > 158* 155* 151* 149* 147*  K 4.4  < > 3.7 4.0 3.1* 3.3* 3.9  CL 115*  < > 129* 125* 120* 118* 117*  CO2 22  < > 23 23 23 23 24   GLUCOSE 142*  < > 136* 124* 120* 125* 115*  BUN 133*  < > 59* 49* 41* 33* 24*  CREATININE 8.50*  < > 2.49* 2.42* 2.04* 1.72* 1.65*  CALCIUM 9.2  < > 8.5* 8.3* 8.2* 8.1* 8.4*  MG  --   --   --   --   --   --  1.7  AST 92*  --   --   --   --   --   --   ALT 60  --   --   --   --   --   --   ALKPHOS 95  --   --   --   --   --   --   BILITOT 0.5  --   --   --   --   --   --   < > = values in this interval not displayed. ------------------------------------------------------------------------------------------------------------------ No results for input(s): CHOL, HDL, LDLCALC, TRIG, CHOLHDL, LDLDIRECT in the last 72 hours.  No results found for: HGBA1C ------------------------------------------------------------------------------------------------------------------ No results for input(s): TSH, T4TOTAL, T3FREE, THYROIDAB in the last 72 hours.  Invalid input(s): FREET3 ------------------------------------------------------------------------------------------------------------------  Recent Labs  07/18/15 0531  VITAMINB12 522    Coagulation profile  Recent Labs Lab 07/12/15 1605  INR 1.57*    No results for input(s): DDIMER in the last 72 hours.  Cardiac Enzymes  Recent Labs Lab 07/12/15 1605 07/12/15 2202 07/13/15 0144  TROPONINI 0.08* 0.06* 0.06*   ------------------------------------------------------------------------------------------------------------------ No results found for: BNP  Inpatient Medications  Scheduled Meds: . amoxicillin-clavulanate  1 tablet Oral Q12H  . antiseptic oral rinse  7 mL Mouth Rinse q12n4p  .  carbamazepine  100 mg Oral TID  . chlorhexidine  15 mL Mouth Rinse BID  . donepezil  10 mg Oral QHS  . escitalopram  20 mg Oral Daily  . fluticasone  2 spray Each Nare Daily  . heparin  5,000 Units Subcutaneous 3 times per day  . memantine  10 mg Oral BID  . mirabegron ER  50 mg Oral q morning - 10a  . montelukast  10 mg Oral Q breakfast  . tamsulosin  0.4 mg Oral Daily   Continuous Infusions: . dextrose 5 % with KCl 20 mEq / L 20 mEq (07/18/15 0834)   PRN Meds:.acetaminophen **OR** [DISCONTINUED] acetaminophen, bisacodyl, traZODone, ziprasidone  Micro Results Recent Results (from the past 240 hour(s))  Blood Culture (routine x 2)     Status: None   Collection Time: 07/12/15 10:00 AM  Result Value Ref Range Status   Specimen Description BLOOD RIGHT ANTECUBITAL  Final   Special Requests BOTTLES DRAWN AEROBIC AND ANAEROBIC 5ML  Final   Culture  Final    NO GROWTH 5 DAYS Performed at [facility]    Report Status 07/17/2015 FINAL  Final  Blood Culture (routine x 2)     Status: None   Collection Time: 07/12/15 10:06 AM  Result Value Ref Range Status   Specimen Description BLOOD RIGHT HAND  Final   Special Requests BOTTLES DRAWN AEROBIC ONLY 5ML  Final   Culture   Final    NO GROWTH 5 DAYS Performed at [facility]    Report Status 07/17/2015 FINAL  Final  Urine culture     Status: None   Collection Time: 07/12/15 10:51 AM  Result Value Ref Range Status   Specimen Description URINE, CATHETERIZED  Final   Special Requests NONE  Final   Culture   Final    NO GROWTH 1 DAY Performed at [facility]    Report Status 07/13/2015 FINAL  Final  MRSA PCR Screening     Status: None   Collection Time: 07/12/15  8:05 PM  Result Value Ref Range Status   MRSA by PCR NEGATIVE NEGATIVE Final    Comment:        The GeneXpert MRSA Assay (FDA approved for NASAL specimens only), is one component of a comprehensive MRSA colonization surveillance program. It is  not intended to diagnose MRSA infection nor to guide or monitor treatment for MRSA infections.   C difficile quick scan w PCR reflex     Status: None   Collection Time: 07/17/15 11:32 AM  Result Value Ref Range Status   C Diff antigen NEGATIVE NEGATIVE Final   C Diff toxin NEGATIVE NEGATIVE Final   C Diff interpretation Negative for toxigenic C. difficile  Final    Radiology Reports Ct Head Wo Contrast  07/12/2015  CLINICAL DATA:  Dementia, bladder cancer, altered mental status EXAM: CT HEAD WITHOUT CONTRAST TECHNIQUE: Contiguous axial images were obtained from the base of the skull through the vertex without intravenous contrast. COMPARISON:  05/06/2015 FINDINGS: Brain: No intracranial hemorrhage, mass effect or midline shift. No acute cortical infarction. No mass lesion is noted on this unenhanced scan. Moderate cerebral atrophy again noted. Stable chronic white matter disease. Vascular: Atherosclerotic calcifications of carotid siphon. Skull: Negative for fracture or focal lesion. Sinuses/Orbits: No acute findings. Other: None. IMPRESSION: No acute intracranial abnormality. Moderate cerebral atrophy again noted. Stable periventricular and patchy subcortical chronic white matter disease. No definite acute cortical infarction. Ventricular size is stable from prior exam. Electronically Signed   By: Lahoma Crocker M.D.   On: 07/12/2015 10:46   Dg Chest Port 1 View  07/15/2015  CLINICAL DATA:  Pneumonia. EXAM: PORTABLE CHEST 1 VIEW COMPARISON:  07/12/2015 . FINDINGS: Mediastinum and hilar structures normal. Left lower lobe infiltrate noted consistent pneumonia. Mild right upper lobe and right base infiltrate. These findings are new. Small left pleural effusion cannot be excluded. No pneumothorax. Interposition of bowel under the left hemidiaphragm again noted . IMPRESSION: Prominent left lower lobe infiltrate. Mild right upper lobe and right lower lobe infiltrate. These findings are new from prior exam  . Aspiration cannot be excluded. Small left pleural effusion cannot be excluded. Electronically Signed   By: Marcello Moores  Register   On: 07/15/2015 07:24   Dg Chest Port 1 View  07/12/2015  CLINICAL DATA:  Sepsis EXAM: PORTABLE CHEST 1 VIEW COMPARISON:  05/07/2015 FINDINGS: Cardiac shadow is within normal limits. Elevation of left hemidiaphragm is noted and stable. Some mild increased density is noted in the right  upper lobe which may represent early infiltrate. No bony abnormality is seen. IMPRESSION: Likely early infiltrate in the right upper lobe. Electronically Signed   By: Inez Catalina M.D.   On: 07/12/2015 14:35   Dg Swallowing Func-speech Pathology  07/16/2015  Objective Swallowing Evaluation: Type of Study: Bedside Swallow Evaluation Patient Details Name: Edsil Irigoyen MRN: [id number] Date of Birth: 02/09/1941 Today's Date: 07/16/2015 Time: SLP Start Time (ACUTE ONLY): 1305-SLP Stop Time (ACUTE ONLY): 1340 SLP Time Calculation (min) (ACUTE ONLY): 35 min Past Medical History: Past Medical History Diagnosis Date . Dementia  . Bladder cancer ([place])  . HTN (hypertension)  . CKD (chronic kidney disease)  Past Surgical History: Past Surgical History Procedure Laterality Date . Back surgery   . Minor hemorrhoidectomy   . Cholecystectomy   HPI: 75 year old male admitted 07/12/15 due to AMS, decline over the past few weeks. PMH signiciant for bladder CA, dementia.  07/15/15 CXR could not rule out aspiration pna.  BSE completed with recommendations for dys1/nectar and MBS.   Subjective: Pt resting in bed Assessment / Plan / Recommendation CHL IP CLINICAL IMPRESSIONS 07/16/2015 Therapy Diagnosis Moderate oral phase dysphagia;Moderate pharyngeal phase dysphagia Clinical Impression Moderate oropharyngeal sensormotor dysphagia characterized by delayed oral transiting and premature loss of boluses into pharynx due to poor lingual control.  Pharyngeal swallow was delayed to pyriform sinus with liquids and weakness resulted in  residauls *worse with increased viscocity.  Pt did not dry swallow on command to faciliate clearance.  There was not significant aspiration but pt is at risk due to weakness and inability to follow directions.  Liquid consumption faciliated pharyngeal clearance of pudding/cracker.  At this time, due to pt's weakness, questionable aspiration, recommend continue dys1/nectar diet with strict precautions.  Skilled intervention included determining which compensation strategies were effective for pt.  SLP to follow for family education.   Impact on safety and function Moderate aspiration risk   CHL IP TREATMENT RECOMMENDATION 07/16/2015 Treatment Recommendations Therapy as outlined in treatment plan below   Prognosis 07/16/2015 Prognosis for Safe Diet Advancement Fair Barriers to Reach Goals Cognitive deficits Barriers/Prognosis Comment -- CHL IP DIET RECOMMENDATION 07/16/2015 SLP Diet Recommendations Nectar thick liquid;Dysphagia 1 (Puree) solids Liquid Administration via Cup Medication Administration Crushed with puree Compensations Slow rate;Small sips/bites;Follow solids with liquid Postural Changes Remain semi-upright after after feeds/meals (Comment)   CHL IP OTHER RECOMMENDATIONS 07/16/2015 Recommended Consults -- Oral Care Recommendations Oral care BID Other Recommendations Order thickener from pharmacy   CHL IP FOLLOW UP RECOMMENDATIONS 07/16/2015 Follow up Recommendations Home health SLP   CHL IP FREQUENCY AND DURATION 07/16/2015 Speech Therapy Frequency (ACUTE ONLY) min 2x/week Treatment Duration 1 week      CHL IP ORAL PHASE 07/16/2015 Oral Phase Impaired Oral - Pudding Teaspoon -- Oral - Pudding Cup -- Oral - Honey Teaspoon -- Oral - Honey Cup -- Oral - Nectar Teaspoon -- Oral - Nectar Cup Weak lingual manipulation;Lingual pumping;Reduced posterior propulsion;Premature spillage;Delayed oral transit Oral - Nectar Straw Weak lingual manipulation;Lingual pumping;Premature spillage;Delayed oral transit;Reduced posterior  propulsion Oral - Thin Teaspoon Weak lingual manipulation;Lingual pumping;Reduced posterior propulsion;Premature spillage Oral - Thin Cup Weak lingual manipulation;Lingual pumping;Reduced posterior propulsion;Premature spillage Oral - Thin Straw Weak lingual manipulation;Lingual pumping;Reduced posterior propulsion;Delayed oral transit;Premature spillage Oral - Puree Weak lingual manipulation;Lingual pumping;Premature spillage;Delayed oral transit;Reduced posterior propulsion Oral - Mech Soft -- Oral - Regular Weak lingual manipulation;Lingual pumping;Delayed oral transit;Impaired mastication;Reduced posterior propulsion;Premature spillage Oral - Multi-Consistency -- Oral - Pill -- Oral Phase - Comment --  CHL IP PHARYNGEAL PHASE 07/16/2015 Pharyngeal Phase Impaired Pharyngeal- Pudding Teaspoon -- Pharyngeal -- Pharyngeal- Pudding Cup -- Pharyngeal -- Pharyngeal- Honey Teaspoon -- Pharyngeal -- Pharyngeal- Honey Cup -- Pharyngeal -- Pharyngeal- Nectar Teaspoon Delayed swallow initiation-pyriform sinuses;Reduced pharyngeal peristalsis;Reduced epiglottic inversion;Pharyngeal residue - valleculae;Pharyngeal residue - pyriform Pharyngeal -- Pharyngeal- Nectar Cup Delayed swallow initiation-pyriform sinuses;Reduced pharyngeal peristalsis;Reduced epiglottic inversion;Pharyngeal residue - valleculae;Pharyngeal residue - pyriform;Penetration/Aspiration during swallow Pharyngeal Material enters airway, CONTACTS cords and not ejected out Pharyngeal- Nectar Straw Delayed swallow initiation-pyriform sinuses;Reduced pharyngeal peristalsis;Reduced epiglottic inversion;Pharyngeal residue - valleculae;Pharyngeal residue - pyriform;Penetration/Aspiration during swallow Pharyngeal Material enters airway, CONTACTS cords and not ejected out;Material enters airway, CONTACTS cords and then ejected out Pharyngeal- Thin Teaspoon Delayed swallow initiation-pyriform sinuses;Reduced pharyngeal peristalsis;Reduced epiglottic inversion;Pharyngeal  residue - valleculae;Pharyngeal residue - pyriform Pharyngeal -- Pharyngeal- Thin Cup Delayed swallow initiation-pyriform sinuses;Reduced pharyngeal peristalsis;Reduced epiglottic inversion;Pharyngeal residue - valleculae;Pharyngeal residue - pyriform;Penetration/Aspiration during swallow Pharyngeal Material enters airway, CONTACTS cords and not ejected out Pharyngeal- Thin Straw Delayed swallow initiation-pyriform sinuses;Reduced pharyngeal peristalsis;Reduced epiglottic inversion;Pharyngeal residue - valleculae;Pharyngeal residue - pyriform Pharyngeal -- Pharyngeal- Puree Delayed swallow initiation-vallecula;Reduced pharyngeal peristalsis;Reduced epiglottic inversion;Pharyngeal residue - valleculae Pharyngeal -- Pharyngeal- Mechanical Soft Delayed swallow initiation-vallecula;Reduced pharyngeal peristalsis;Reduced epiglottic inversion;Pharyngeal residue - valleculae Pharyngeal -- Pharyngeal- Regular -- Pharyngeal -- Pharyngeal- Multi-consistency -- Pharyngeal -- Pharyngeal- Pill -- Pharyngeal -- Pharyngeal Comment pt did not dry swallow on command nor cough likely due to his mentation  CHL IP CERVICAL ESOPHAGEAL PHASE 07/16/2015 Cervical Esophageal Phase Impaired Pudding Teaspoon -- Pudding Cup -- Honey Teaspoon -- Honey Cup -- Nectar Teaspoon -- Nectar Cup -- Nectar Straw -- Thin Teaspoon -- Thin Cup -- Thin Straw -- Puree -- Mechanical Soft -- Regular -- Multi-consistency -- Pill -- Cervical Esophageal Comment accumulation of barium at pyriform sinus mixed with secretions, esophageal sweep revealed clear distal esophagus  - radiologist not present to confirm No flowsheet data found. Janett Labella [place], [place] [facility] SLP [phone]               Time Spent in minutes  [place] DO on 07/18/2015 at 1:03 PM   [email or web address] - password [facility]  [facility]

## 2015-07-18 NOTE — Evaluation (Signed)
Physical Therapy Evaluation Patient Details Name: Roberto Campbell MRN: [id number] DOB: Oct 24, 1940 Today's Date: 07/18/2015   History of Present Illness  75 yo male admitted with sepsis. Hx of dementia, bladder cancer, HTN, CKD. Pt is from a memory care SNF.  Clinical Impression  On eval, pt required max assist +2 for mobility. Pt stood x3 (~5 seconds each attempt) before sitting abruptly. Pt spoke a few words during session. Followed commands inconsistently. No family present during session. Recommend SNF.     Follow Up Recommendations SNF    Equipment Recommendations  None recommended by PT    Recommendations for Other Services       Precautions / Restrictions Precautions Precautions: Fall Restrictions Weight Bearing Restrictions: No      Mobility  Bed Mobility Overal bed mobility: Needs Assistance Bed Mobility: Supine to Sit;Sit to Supine     Supine to sit: Total assist;+2 for physical assistance;+2 for safety/equipment Sit to supine: Min assist;+2 for safety/equipment   General bed mobility comments: Total assist to get to EOB-pt did not initiate or follow commands. Improvement in multimoal cues/commands and initiation of task for sit to supine.   Transfers Overall transfer level: Needs assistance Equipment used: 2 person hand held assist Transfers: Sit to/from Stand Sit to Stand: Max assist;+2 physical assistance;+2 safety/equipment         General transfer comment: x3. Assist to rise, stabilzie, control descent. Pt would stand briefly before abruptly sitting each time. Pt also appered to be avoiding placing weight on L LE.   Ambulation/Gait             General Gait Details: NT-pt unable  Stairs            Wheelchair Mobility    Modified Rankin (Stroke Patients Only)       Balance Overall balance assessment: Needs assistance Sitting-balance support: Feet supported;Bilateral upper extremity supported Sitting balance-Leahy Scale: Poor Sitting  balance - Comments: Initially required Mod assist for static sitting balance but progress to Min guard assist with time.                                      Pertinent Vitals/Pain Pain Assessment: Faces Faces Pain Scale: No hurt    Home Living                        Prior Function                 Hand Dominance        Extremity/Trunk Assessment                         Communication      Cognition Arousal/Alertness: Awake/alert Behavior During Therapy: Flat affect Overall Cognitive Status: History of cognitive impairments - at baseline Area of Impairment: Following commands       Following Commands: Follows one step commands inconsistently       General Comments: only a few verbal responses    General Comments      Exercises        Assessment/Plan    PT Assessment Patient needs continued PT services  PT Diagnosis Difficulty walking;Generalized weakness;Altered mental status   PT Problem List Decreased activity tolerance;Decreased balance;Decreased mobility;Decreased strength;Decreased knowledge of use of DME;Decreased cognition  PT Treatment Interventions Gait training;Functional mobility training;Therapeutic activities;Patient/family education;Balance training;Therapeutic exercise;DME instruction  PT Goals (Current goals can be found in the Care Plan section) Acute Rehab PT Goals Patient Stated Goal: none stated PT Goal Formulation: Patient unable to participate in goal setting Time For Goal Achievement: 08/01/15 Potential to Achieve Goals: Fair    Frequency Min 2X/week   Barriers to discharge        Co-evaluation               End of Session Equipment Utilized During Treatment: Gait belt Activity Tolerance: Patient tolerated treatment well Patient left: in bed;with call bell/phone within reach;with bed alarm set           Time: 0930-1000 PT Time Calculation (min) (ACUTE ONLY): 30  min   Charges:   PT Evaluation $PT Eval Low Complexity: 1 Procedure PT Treatments $Therapeutic Activity: 8-22 mins   PT G Codes:        Weston Anna, MPT Pager: [phone]

## 2015-07-18 NOTE — Progress Notes (Signed)
Nutrition Follow-up  DOCUMENTATION CODES:   Not applicable  INTERVENTION:  - Will order Magic Cup BID with meals, each supplement provides 290 kcal and 9 grams of protein - Continue feeding assistance at meals - RD will continue to monitor for needs  NUTRITION DIAGNOSIS:   Inadequate oral intake related to lethargy/confusion as evidenced by per patient/family report. -ongoing mainly  GOAL:   Patient will meet greater than or equal to 90% of their needs -unmet  MONITOR:   PO intake, Supplement acceptance, Labs, Skin, I & O's  ASSESSMENT:   Roberto Campbell is an 75 y.o. male with a PMH of bladder cancer and dementia, sent from his nursing facility with a 2 week history of gradual decline in his usual condition.  4/3 Nutrition needs adjusted based on medical condition and course, age. Per chart review, pt ate 25% of breakfast, 60% of lunch, and 50% of dinner 4/1 and 25% of breakfast 4/2. Pt noted to have hx of dementia and be unable to provide information. PT finished working with pt at time of RD visit and no family/visitors present.   Pt not meeting needs. Diet advanced since previous assessment. Will order Magic Cup as pt on nectar-thick liquids. Medications reviewed. IVF: D5-20 mEq KCl @ 100 mL/hr (408 kcal). Labs reviewed; Na: 147 mmol/L, Cl: 117 mmol/L, BUN/creatinine elevated but trending down, Ca: 8.4 mg/dL, GFR: 40.   3/29 - Spoke with Pt's son at bedside.  - Pt is confused, agitated. - Son states as far he knows, prior to 2 week decline, pt was eating well. - During that time, pt ate poorly. - No weight loss that son knows of.  - Pt's wt is currently stable per chart. - PO intake 0% in chart for dinner last night and breakfast this morning.  - He is a full feeding assist.  - No issues with chewing/swallowing at this point in time. - No nausea/vomiting.  Nutrition-Focused physical exam completed. Findings are no fat depletion, no muscle depletion, and no edema.     Diet Order:  DIET - DYS 1 Room service appropriate?: Yes with Assist; Fluid consistency:: Nectar Thick  Skin:  Wound (see comment) (Sacral DTI)  Last BM:  4/2  Height:   Ht Readings from Last 1 Encounters:  07/12/15 6' (1.829 m)    Weight:   Wt Readings from Last 1 Encounters:  07/12/15 210 lb (95.255 kg)    Ideal Body Weight:  80.91 kg  BMI:  Body mass index is 28.47 kg/(m^2).  Estimated Nutritional Needs:   Kcal:  1700-1900  Protein:  90-100 grams  Fluid:  >/= 1.9L  EDUCATION NEEDS:   No education needs identified at this time     Jarome Matin, RD, LDN Inpatient Clinical Dietitian Pager # [phone] After hours/weekend pager # [phone]

## 2015-07-19 ENCOUNTER — Inpatient Hospital Stay (HOSPITAL_COMMUNITY): Payer: Medicare Other

## 2015-07-19 LAB — FOLATE RBC
FOLATE, HEMOLYSATE: 332.6 ng/mL
FOLATE, RBC: 993 ng/mL (ref >498)
HEMATOCRIT: 33.5 % — AB (ref 37.5–51.0)

## 2015-07-19 LAB — CBC
HCT: 34.9 % — ABNORMAL LOW (ref 39.0–52.0)
Hemoglobin: 11.8 g/dL — ABNORMAL LOW (ref 13.0–17.0)
MCH: 30.6 pg (ref 26.0–34.0)
MCHC: 33.8 g/dL (ref 30.0–36.0)
MCV: 90.4 fL (ref 78.0–100.0)
PLATELETS: 155 10*3/uL (ref 150–400)
RBC: 3.86 MIL/uL — AB (ref 4.22–5.81)
RDW: 13.1 % (ref 11.5–15.5)
WBC: 7.2 10*3/uL (ref 4.0–10.5)

## 2015-07-19 LAB — BASIC METABOLIC PANEL
ANION GAP: 7 (ref 5–15)
BUN: 19 mg/dL (ref 6–20)
CALCIUM: 8.5 mg/dL — AB (ref 8.9–10.3)
CO2: 24 mmol/L (ref 22–32)
Chloride: 114 mmol/L — ABNORMAL HIGH (ref 101–111)
Creatinine, Ser: 1.27 mg/dL — ABNORMAL HIGH (ref 0.61–1.24)
GFR calc Af Amer: >60 mL/min (ref >60)
GFR, EST NON AFRICAN AMERICAN: 54 mL/min — AB (ref >60)
Glucose, Bld: 112 mg/dL — ABNORMAL HIGH (ref 65–99)
POTASSIUM: 4.4 mmol/L (ref 3.5–5.1)
SODIUM: 145 mmol/L (ref 135–145)

## 2015-07-19 NOTE — NC FL2 (Signed)
[place] LEVEL OF CARE SCREENING TOOL     IDENTIFICATION  Patient Name: Roberto Campbell Birthdate: 12-Nov-1940 Sex: male Admission Date (Current Location): 07/12/2015  [facility] and [place] Number:  [place] and Address:  [facility],  [place], [place]      Provider Number: [id number]  Attending Physician Name and Address:  Geradine Girt, DO  Relative Name and Phone Number:       Current Level of Care: Hospital Recommended Level of Care: [place] Prior Approval Number:    Date Approved/Denied:   PASRR Number: [id number]  Discharge Plan: SNF    Current Diagnoses: Patient Active Problem List   Diagnosis Date Noted  . ARF (acute renal failure) ([place]) 07/12/2015  . Sepsis ([place]) 07/12/2015  . Hypernatremia 07/12/2015  . Dehydration 07/12/2015  . Thrombocytopenia ([place]) 07/12/2015  . Elevated troponin 07/12/2015  . 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 Pressure ulcer 07/12/2015  . HCAP (healthcare-associated pneumonia) 07/12/2015  . Dementia with behavioral disturbance 05/07/2015    Orientation RESPIRATION BLADDER Height & Weight      (DO x 4)  Normal Incontinent, External catheter Weight: 210 lb (95.255 kg) Height:  6' (182.9 cm)  BEHAVIORAL SYMPTOMS/MOOD NEUROLOGICAL BOWEL NUTRITION STATUS  Other (Comment) (can be uncooperative and resistant to care at times due to dementia)  (NONE) Incontinent  (Nectar thick liquid;Dysphagia 1 (Puree) solids)  AMBULATORY STATUS COMMUNICATION OF NEEDS Skin   Extensive Assist   Other (Comment) (DTP, sacrum, dressing changed PRN)                       Personal Care Assistance Level of Assistance  Bathing, Dressing, Feeding Bathing Assistance: Maximum assistance Feeding assistance: Limited assistance (Full supervision/cueing for compensatory strategies) Dressing Assistance: Maximum assistance     Functional Limitations Info   Sight, Hearing, Speech Sight Info: Adequate Hearing Info: Adequate Speech Info: Adequate    SPECIAL CARE FACTORS FREQUENCY  PT (By licensed PT)     PT Frequency: 5 x a week              Contractures Contractures Info: Not present    Additional Factors Info  Code Status, Allergies, Isolation Precautions Code Status Info: DNR Allergies Info: Morphine And Related, Alprazolam, Atorvastatin, Meperidine     Isolation Precautions Info: Enteric     Current Medications (07/19/2015):  This is the current hospital active medication list Current Facility-Administered Medications  Medication Dose Route Frequency Provider Last Rate Last Dose  . acetaminophen (TYLENOL) tablet 650 mg  650 mg Oral Q6H PRN Venetia Maxon Rama, MD   650 mg at 07/18/15 2135  . amoxicillin-clavulanate (AUGMENTIN) 875-125 MG per tablet 1 tablet  1 tablet Oral Q12H Delfina Redwood, MD   1 tablet at 07/19/15 1042  . antiseptic oral rinse (CPC / CETYLPYRIDINIUM CHLORIDE 0.05%) solution 7 mL  7 mL Mouth Rinse q12n4p Maryann Mikhail, DO   7 mL at 07/19/15 1200  . bisacodyl (DULCOLAX) suppository 10 mg  10 mg Rectal Daily PRN Venetia Maxon Rama, MD      . carbamazepine (TEGRETOL) chewable tablet 100 mg  100 mg Oral TID Delfina Redwood, MD   100 mg at 07/19/15 1042  . chlorhexidine (PERIDEX) 0.12 % solution 15 mL  15 mL Mouth Rinse BID Maryann Mikhail, DO   15 mL at 07/19/15 1041  . dextrose 5 % with KCl 20 mEq / L  infusion  20 mEq Intravenous Continuous Delfina Redwood, MD 100 mL/hr at 07/19/15 1416 20 mEq at 07/19/15 1416  . donepezil (ARICEPT) tablet 10 mg  10 mg Oral QHS Delfina Redwood, MD   10 mg at 07/18/15 2133  . escitalopram (LEXAPRO) tablet 20 mg  20 mg Oral Daily Delfina Redwood, MD   20 mg at 07/19/15 1000  . fluticasone (FLONASE) 50 MCG/ACT nasal spray 2 spray  2 spray Each Nare Daily Maryann Mikhail, DO   2 spray at 07/19/15 1041  . heparin injection 5,000 Units  5,000 Units Subcutaneous 3 times  per day Venetia Maxon Rama, MD   5,000 Units at 07/19/15 1417  . memantine (NAMENDA) tablet 10 mg  10 mg Oral BID Delfina Redwood, MD   10 mg at 07/19/15 1042  . mirabegron ER (MYRBETRIQ) tablet 50 mg  50 mg Oral q morning - 10a Delfina Redwood, MD   50 mg at 07/19/15 1042  . montelukast (SINGULAIR) tablet 10 mg  10 mg Oral Q breakfast Delfina Redwood, MD   10 mg at 07/19/15 1042  . tamsulosin (FLOMAX) capsule 0.4 mg  0.4 mg Oral Daily Delfina Redwood, MD   0.4 mg at 07/19/15 1041  . traZODone (DESYREL) tablet 50 mg  50 mg Oral QHS PRN Delfina Redwood, MD   50 mg at 07/18/15 2135     Discharge Medications: Please see discharge summary for a list of discharge medications.  Relevant Imaging Results:  Relevant Lab Results:   Additional Information SSN: [id number]  Blayton Huttner, [place] Better A, LCSW

## 2015-07-19 NOTE — Clinical Social Work Placement (Signed)
   CLINICAL SOCIAL WORK PLACEMENT  NOTE  Date:  07/19/2015  Patient Details  Name: Roberto Campbell MRN: [id number] Date of Birth: 1940-11-14  Clinical Social Work is seeking post-discharge placement for this patient at the [place] level of care (*CSW will initial, date and re-position this form in  chart as items are completed):  Yes   Patient/family provided with [facility] Work Department's list of facilities offering this level of care within the geographic area requested by the patient (or if unable, by the patient's family).  Yes   Patient/family informed of their freedom to choose among providers that offer the needed level of care, that participate in Medicare, Medicaid or managed care program needed by the patient, have an available bed and are willing to accept the patient.  Yes   Patient/family informed of [facility]'s ownership interest in [facility] and [facility], as well as of the fact that they are under no obligation to receive care at these facilities.  PASRR submitted to EDS on       PASRR number received on       Existing PASRR number confirmed on 07/19/15     FL2 transmitted to all facilities in geographic area requested by pt/family on 07/19/15     FL2 transmitted to all facilities within larger geographic area on       Patient informed that his/her managed care company has contracts with or will negotiate with certain facilities, including the following:            Patient/family informed of bed offers received.  Patient chooses bed at       Physician recommends and patient chooses bed at      Patient to be transferred to   on  .  Patient to be transferred to facility by       Patient family notified on   of transfer.  Name of family member notified:        PHYSICIAN Please sign FL2, Please sign DNR     Additional Comment:    _______________________________________________ Ladell Pier,  LCSW 07/19/2015, 3:17 PM

## 2015-07-19 NOTE — NC FL2 (Signed)
[place] LEVEL OF CARE SCREENING TOOL     IDENTIFICATION  Patient Name: Roberto Campbell Birthdate: [birth year] Sex: male Admission Date (Current Location): 07/12/2015  [facility] and [place] Number:  [place] and Address:  [facility],  [place] [address] [place]      Provider Number: [id number]  Attending Physician Name and Address:  Geradine Girt, DO  Relative Name and Phone Number:       Current Level of Care: Hospital Recommended Level of Care: [place] Prior Approval Number:    Date Approved/Denied:   PASRR Number:    Discharge Plan: SNF    Current Diagnoses: Patient Active Problem List   Diagnosis Date Noted  . ARF (acute renal failure) ([place]) 07/12/2015  . Sepsis ([place]) 07/12/2015  . Hypernatremia 07/12/2015  . Dehydration 07/12/2015  . Thrombocytopenia ([place]) 07/12/2015  . Elevated troponin 07/12/2015  . 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 Pressure ulcer 07/12/2015  . HCAP (healthcare-associated pneumonia) 07/12/2015  . Dementia with behavioral disturbance 05/07/2015    Orientation RESPIRATION BLADDER Height & Weight      (DO x 4)  Normal Incontinent, External catheter Weight: 210 lb (95.255 kg) Height:  6' (182.9 cm)  BEHAVIORAL SYMPTOMS/MOOD NEUROLOGICAL BOWEL NUTRITION STATUS  Other (Comment) (can be uncooperative and resistant to care at times due to dementia)  (NONE) Incontinent  (Nectar thick liquid;Dysphagia 1 (Puree) solids)  AMBULATORY STATUS COMMUNICATION OF NEEDS Skin   Extensive Assist   Other (Comment) (DTP, sacrum, dressing changed PRN)                       Personal Care Assistance Level of Assistance  Bathing, Dressing, Feeding Bathing Assistance: Maximum assistance Feeding assistance: Limited assistance (Full supervision/cueing for compensatory strategies) Dressing Assistance: Maximum assistance     Functional Limitations Info  Sight,  Hearing, Speech Sight Info: Adequate Hearing Info: Adequate Speech Info: Adequate    SPECIAL CARE FACTORS FREQUENCY  PT (By licensed PT)     PT Frequency: 5 x a week              Contractures Contractures Info: Not present    Additional Factors Info  Code Status, Allergies, Isolation Precautions Code Status Info: DNR Allergies Info: Morphine And Related, Alprazolam, Atorvastatin, Meperidine     Isolation Precautions Info: Enteric     Current Medications (07/19/2015):  This is the current hospital active medication list Current Facility-Administered Medications  Medication Dose Route Frequency Provider Last Rate Last Dose  . acetaminophen (TYLENOL) tablet 650 mg  650 mg Oral Q6H PRN Venetia Maxon Rama, MD   650 mg at 07/18/15 2135  . amoxicillin-clavulanate (AUGMENTIN) 875-125 MG per tablet 1 tablet  1 tablet Oral Q12H Delfina Redwood, MD   1 tablet at 07/19/15 1042  . antiseptic oral rinse (CPC / CETYLPYRIDINIUM CHLORIDE 0.05%) solution 7 mL  7 mL Mouth Rinse q12n4p Maryann Mikhail, DO   7 mL at 07/18/15 1618  . bisacodyl (DULCOLAX) suppository 10 mg  10 mg Rectal Daily PRN Venetia Maxon Rama, MD      . carbamazepine (TEGRETOL) chewable tablet 100 mg  100 mg Oral TID Delfina Redwood, MD   100 mg at 07/19/15 1042  . chlorhexidine (PERIDEX) 0.12 % solution 15 mL  15 mL Mouth Rinse BID Maryann Mikhail, DO   15 mL at 07/19/15 1041  . dextrose 5 % with KCl 20 mEq / L  infusion  20 mEq Intravenous Continuous Delfina Redwood, MD 100 mL/hr at 07/18/15 1804 20 mEq at 07/18/15 1804  . donepezil (ARICEPT) tablet 10 mg  10 mg Oral QHS Delfina Redwood, MD   10 mg at 07/18/15 2133  . escitalopram (LEXAPRO) tablet 20 mg  20 mg Oral Daily Delfina Redwood, MD   20 mg at 07/19/15 1000  . fluticasone (FLONASE) 50 MCG/ACT nasal spray 2 spray  2 spray Each Nare Daily Maryann Mikhail, DO   2 spray at 07/19/15 1041  . heparin injection 5,000 Units  5,000 Units Subcutaneous 3 times per day  Venetia Maxon Rama, MD   5,000 Units at 07/19/15 0600  . memantine (NAMENDA) tablet 10 mg  10 mg Oral BID Delfina Redwood, MD   10 mg at 07/19/15 1042  . mirabegron ER (MYRBETRIQ) tablet 50 mg  50 mg Oral q morning - 10a Delfina Redwood, MD   50 mg at 07/19/15 1042  . montelukast (SINGULAIR) tablet 10 mg  10 mg Oral Q breakfast Delfina Redwood, MD   10 mg at 07/19/15 1042  . tamsulosin (FLOMAX) capsule 0.4 mg  0.4 mg Oral Daily Delfina Redwood, MD   0.4 mg at 07/19/15 1041  . traZODone (DESYREL) tablet 50 mg  50 mg Oral QHS PRN Delfina Redwood, MD   50 mg at 07/18/15 2135     Discharge Medications: Please see discharge summary for a list of discharge medications.  Relevant Imaging Results:  Relevant Lab Results:   Additional Information SSN: [id number]  Sandara Tyree, [place] Better A, LCSW

## 2015-07-19 NOTE — Progress Notes (Addendum)
TRH Progress Note                                                                                                                                                                                                                      Patient Demographics:    Roberto Campbell, is a 75 y.o. male, DOB - [birth year], [id number]  Admit date - 07/12/2015   Admitting Physician Venetia Maxon Rama, MD  Outpatient Primary MD for the patient is No primary care provider on file.  LOS - 7  Outpatient Specialists:   Chief Complaint  Patient presents with  . Altered Mental Status    Summary  75 year old male with a PMH of dementia, bladder cancer, HTN, chronic kidney disease, a resident of SNF, sent to the ED on 07/12/15 with two-week history of gradual decline in his usual condition, not been eating or drinking well or getting out of bed. In the ED he was nonverbal and unable to provide any history. Initial evaluation showed markedly elevated creatinine and hypotension concerning for sepsis etiology but blood pressure responded to IV fluid challenge. Admitted for sepsis, acute kidney injury and hypernatremia.      Subjective:    Ate about 25% of meal today Sleepy at the time of my exam but nurse reports more communicative this AM   Assessment  & Plan :     1. Sepsis due to Pneumonia along with likely UTI. Sepsis physiology has resolved with hydration and empiric antibiotics, initially treated with IV vancomycin and Zosyn, vancomycin stopped on 06/27/2015. There is question of possible aspiration due to underlying dementia. speech therapy is following and currently on dysphagia 1 diet. Clinically improved. All cultures negative. Change to augmentin  2. Severe dehydration with ARF and hypernatremia. Na, bun and creat still high, and clinically dry. Continue hypotonic IVF  3. Sacral  pressure sore.  see nursing note for details. Continue supportive care.  4. Prolonged QTC on admission. Resolved.   5. Asymptomatic bradycardia. - chronic. Will d/c tele  6. Sepsis related thrombocytopenia. Platelet count much improved. Continue treatment for underlying cause.  7. Mild troponin rise in non-ACS pattern due to demand ischemia from sepsis. EKG nonacute. No chest pain. On aspirin continue.  Hypokalemia: replete.  Dysphagia: see above  Dementia:   Resume some home meds. See orders   Code Status : DNR  Family Communication: no family at bedside- called Devin Going- no answer  Disposition Plan  : SNF in AM?  Consults  :  Speech  Procedures  :   DVT Prophylaxis  :    Heparin    Lab Results  Component Value Date   PLT 155 07/19/2015    Antibiotics  :    Anti-infectives    Start     Dose/Rate Route Frequency Ordered Stop   07/17/15 2200  amoxicillin-clavulanate (AUGMENTIN) 875-125 MG per tablet 1 tablet     1 tablet Oral Every 12 hours 07/17/15 1619     07/14/15 1800  piperacillin-tazobactam (ZOSYN) IVPB 3.375 g  Status:  Discontinued     3.375 g 12.5 mL/hr over 240 Minutes Intravenous Every 8 hours 07/14/15 1134 07/17/15 1619   07/14/15 1200  vancomycin (VANCOCIN) IVPB 1000 mg/200 mL premix     1,000 mg 200 mL/hr over 60 Minutes Intravenous  Once 07/14/15 1124 07/14/15 1247   07/12/15 1800  piperacillin-tazobactam (ZOSYN) IVPB 2.25 g  Status:  Discontinued     2.25 g 100 mL/hr over 30 Minutes Intravenous Every 8 hours 07/12/15 1141 07/12/15 1155   07/12/15 1800  piperacillin-tazobactam (ZOSYN) IVPB 2.25 g  Status:  Discontinued     2.25 g 100 mL/hr over 30 Minutes Intravenous Every 8 hours 07/12/15 1315 07/14/15 1134   07/12/15 1415  piperacillin-tazobactam (ZOSYN) IVPB 3.375 g  Status:  Discontinued     3.375 g 100 mL/hr over 30 Minutes Intravenous  Once 07/12/15 1405 07/12/15 1425   07/12/15 1415  vancomycin (VANCOCIN) IVPB 1000 mg/200 mL premix   Status:  Discontinued     1,000 mg 200 mL/hr over 60 Minutes Intravenous  Once 07/12/15 1405 07/12/15 1425   07/12/15 1330  vancomycin (VANCOCIN) IVPB 1000 mg/200 mL premix     1,000 mg 200 mL/hr over 60 Minutes Intravenous NOW 07/12/15 1315 07/12/15 1616   07/12/15 1145  vancomycin (VANCOCIN) IVPB 1000 mg/200 mL premix  Status:  Discontinued     1,000 mg 200 mL/hr over 60 Minutes Intravenous NOW 07/12/15 1141 07/12/15 1155   07/12/15 0945  piperacillin-tazobactam (ZOSYN) IVPB 3.375 g     3.375 g 100 mL/hr over 30 Minutes Intravenous  Once 07/12/15 0934 07/12/15 1040   07/12/15 0945  vancomycin (VANCOCIN) IVPB 1000 mg/200 mL premix     1,000 mg 200 mL/hr over 60 Minutes Intravenous  Once 07/12/15 0934 07/12/15 1155        Objective:   Filed Vitals:   07/18/15 1807 07/18/15 2057 07/18/15 2134 07/19/15 0741  BP:  140/105 156/63 137/66  Pulse:  92  57  Temp:    97.5 F (36.4 C)  TempSrc:    Oral  Resp:  18  18  Height:      Weight:      SpO2: 95%   95%    Wt Readings from Last 3 Encounters:  07/12/15 95.255 kg (210 lb)  05/06/15 95.255 kg (210 lb)     Intake/Output Summary (Last 24 hours) at 07/19/15 1410 Last data filed at 07/19/15 0801  Gross per 24 hour  Intake   1700 ml  Output      0 ml  Net   1700 ml     Physical Exam  Sleepy this AM Symmetrical Chest wall movement, Good air movement bilaterally, CTAB RRR,No Gallops,Rubs or new Murmurs, No Parasternal Heave +ve B.Sounds, Abd Soft, No tenderness, No organomegaly appriciated, No rebound - guarding or rigidity. No Cyanosis, Clubbing or edema, No new Rash or bruise     Data Review:    CBC  Recent Labs Lab 07/13/15  0144 07/14/15 0530 07/15/15 0042 07/16/15 0544 07/19/15 0533  WBC 7.7 8.2 7.0 6.8 7.2  HGB 11.3* 12.3* 11.5* 11.4* 11.8*  HCT 35.3* 37.7* 35.4* 34.3* 34.9*  PLT 96* 117* 128* 125* 155  MCV 94.6 94.0 93.2 92.0 90.4  MCH 30.3 30.7 30.3 30.6 30.6  MCHC 32.0 32.6 32.5 33.2 33.8  RDW 14.0  14.1 14.0 13.7 13.1    Chemistries   Recent Labs Lab 07/15/15 1825 07/16/15 0544 07/17/15 0520 07/18/15 0531 07/19/15 0533  NA 155* 151* 149* 147* 145  K 4.0 3.1* 3.3* 3.9 4.4  CL 125* 120* 118* 117* 114*  CO2 23 23 23 24 24   GLUCOSE 124* 120* 125* 115* 112*  BUN 49* 41* 33* 24* 19  CREATININE 2.42* 2.04* 1.72* 1.65* 1.27*  CALCIUM 8.3* 8.2* 8.1* 8.4* 8.5*  MG  --   --   --  1.7  --    ------------------------------------------------------------------------------------------------------------------ No results for input(s): CHOL, HDL, LDLCALC, TRIG, CHOLHDL, LDLDIRECT in the last 72 hours.  No results found for: HGBA1C ------------------------------------------------------------------------------------------------------------------ No results for input(s): TSH, T4TOTAL, T3FREE, THYROIDAB in the last 72 hours.  Invalid input(s): FREET3 ------------------------------------------------------------------------------------------------------------------  Recent Labs  07/18/15 0531  VITAMINB12 522    Coagulation profile  Recent Labs Lab 07/12/15 1605  INR 1.57*    No results for input(s): DDIMER in the last 72 hours.  Cardiac Enzymes  Recent Labs Lab 07/12/15 1605 07/12/15 2202 07/13/15 0144  TROPONINI 0.08* 0.06* 0.06*   ------------------------------------------------------------------------------------------------------------------ No results found for: BNP  Inpatient Medications  Scheduled Meds: . amoxicillin-clavulanate  1 tablet Oral Q12H  . antiseptic oral rinse  7 mL Mouth Rinse q12n4p  . carbamazepine  100 mg Oral TID  . chlorhexidine  15 mL Mouth Rinse BID  . donepezil  10 mg Oral QHS  . escitalopram  20 mg Oral Daily  . fluticasone  2 spray Each Nare Daily  . heparin  5,000 Units Subcutaneous 3 times per day  . memantine  10 mg Oral BID  . mirabegron ER  50 mg Oral q morning - 10a  . montelukast  10 mg Oral Q breakfast  . tamsulosin  0.4 mg  Oral Daily   Continuous Infusions: . dextrose 5 % with KCl 20 mEq / L 20 mEq (07/18/15 1804)   PRN Meds:.acetaminophen **OR** [DISCONTINUED] acetaminophen, bisacodyl, traZODone  Micro Results Recent Results (from the past 240 hour(s))  Blood Culture (routine x 2)     Status: None   Collection Time: 07/12/15 10:00 AM  Result Value Ref Range Status   Specimen Description BLOOD RIGHT ANTECUBITAL  Final   Special Requests BOTTLES DRAWN AEROBIC AND ANAEROBIC 5ML  Final   Culture   Final    NO GROWTH 5 DAYS Performed at [facility]    Report Status 07/17/2015 FINAL  Final  Blood Culture (routine x 2)     Status: None   Collection Time: 07/12/15 10:06 AM  Result Value Ref Range Status   Specimen Description BLOOD RIGHT HAND  Final   Special Requests BOTTLES DRAWN AEROBIC ONLY 5ML  Final   Culture   Final    NO GROWTH 5 DAYS Performed at [facility]    Report Status 07/17/2015 FINAL  Final  Urine culture     Status: None   Collection Time: 07/12/15 10:51 AM  Result Value Ref Range Status   Specimen Description URINE, CATHETERIZED  Final   Special Requests NONE  Final   Culture  Final    NO GROWTH 1 DAY Performed at [facility]    Report Status 07/13/2015 FINAL  Final  MRSA PCR Screening     Status: None   Collection Time: 07/12/15  8:05 PM  Result Value Ref Range Status   MRSA by PCR NEGATIVE NEGATIVE Final    Comment:        The GeneXpert MRSA Assay (FDA approved for NASAL specimens only), is one component of a comprehensive MRSA colonization surveillance program. It is not intended to diagnose MRSA infection nor to guide or monitor treatment for MRSA infections.   C difficile quick scan w PCR reflex     Status: None   Collection Time: 07/17/15 11:32 AM  Result Value Ref Range Status   C Diff antigen NEGATIVE NEGATIVE Final   C Diff toxin NEGATIVE NEGATIVE Final   C Diff interpretation Negative for toxigenic C. difficile  Final     Radiology Reports Ct Head Wo Contrast  07/12/2015  CLINICAL DATA:  Dementia, bladder cancer, altered mental status EXAM: CT HEAD WITHOUT CONTRAST TECHNIQUE: Contiguous axial images were obtained from the base of the skull through the vertex without intravenous contrast. COMPARISON:  05/06/2015 FINDINGS: Brain: No intracranial hemorrhage, mass effect or midline shift. No acute cortical infarction. No mass lesion is noted on this unenhanced scan. Moderate cerebral atrophy again noted. Stable chronic white matter disease. Vascular: Atherosclerotic calcifications of carotid siphon. Skull: Negative for fracture or focal lesion. Sinuses/Orbits: No acute findings. Other: None. IMPRESSION: No acute intracranial abnormality. Moderate cerebral atrophy again noted. Stable periventricular and patchy subcortical chronic white matter disease. No definite acute cortical infarction. Ventricular size is stable from prior exam. Electronically Signed   By: Lahoma Crocker M.D.   On: 07/12/2015 10:46   Dg Chest Port 1 View  07/15/2015  CLINICAL DATA:  Pneumonia. EXAM: PORTABLE CHEST 1 VIEW COMPARISON:  07/12/2015 . FINDINGS: Mediastinum and hilar structures normal. Left lower lobe infiltrate noted consistent pneumonia. Mild right upper lobe and right base infiltrate. These findings are new. Small left pleural effusion cannot be excluded. No pneumothorax. Interposition of bowel under the left hemidiaphragm again noted . IMPRESSION: Prominent left lower lobe infiltrate. Mild right upper lobe and right lower lobe infiltrate. These findings are new from prior exam . Aspiration cannot be excluded. Small left pleural effusion cannot be excluded. Electronically Signed   By: Marcello Moores  Register   On: 07/15/2015 07:24   Dg Chest Port 1 View  07/12/2015  CLINICAL DATA:  Sepsis EXAM: PORTABLE CHEST 1 VIEW COMPARISON:  05/07/2015 FINDINGS: Cardiac shadow is within normal limits. Elevation of left hemidiaphragm is noted and stable. Some mild  increased density is noted in the right upper lobe which may represent early infiltrate. No bony abnormality is seen. IMPRESSION: Likely early infiltrate in the right upper lobe. Electronically Signed   By: Inez Catalina M.D.   On: 07/12/2015 14:35   Dg Swallowing Func-speech Pathology  07/16/2015  Objective Swallowing Evaluation: Type of Study: Bedside Swallow Evaluation Patient Details Name: Zenas Mank MRN: [id number] Date of Birth: [birth year] Today's Date: 07/16/2015 Time: SLP Start Time (ACUTE ONLY): 1305-SLP Stop Time (ACUTE ONLY): 1340 SLP Time Calculation (min) (ACUTE ONLY): 35 min Past Medical History: Past Medical History Diagnosis Date . Dementia  . Bladder cancer ([place])  . HTN (hypertension)  . CKD (chronic kidney disease)  Past Surgical History: Past Surgical History Procedure Laterality Date . Back surgery   . Minor hemorrhoidectomy   . Cholecystectomy  HPI: 75 year old male admitted 07/12/15 due to AMS, decline over the past few weeks. PMH signiciant for bladder CA, dementia.  07/15/15 CXR could not rule out aspiration pna.  BSE completed with recommendations for dys1/nectar and MBS.   Subjective: Pt resting in bed Assessment / Plan / Recommendation CHL IP CLINICAL IMPRESSIONS 07/16/2015 Therapy Diagnosis Moderate oral phase dysphagia;Moderate pharyngeal phase dysphagia Clinical Impression Moderate oropharyngeal sensormotor dysphagia characterized by delayed oral transiting and premature loss of boluses into pharynx due to poor lingual control.  Pharyngeal swallow was delayed to pyriform sinus with liquids and weakness resulted in residauls *worse with increased viscocity.  Pt did not dry swallow on command to faciliate clearance.  There was not significant aspiration but pt is at risk due to weakness and inability to follow directions.  Liquid consumption faciliated pharyngeal clearance of pudding/cracker.  At this time, due to pt's weakness, questionable aspiration, recommend continue dys1/nectar diet  with strict precautions.  Skilled intervention included determining which compensation strategies were effective for pt.  SLP to follow for family education.   Impact on safety and function Moderate aspiration risk   CHL IP TREATMENT RECOMMENDATION 07/16/2015 Treatment Recommendations Therapy as outlined in treatment plan below   Prognosis 07/16/2015 Prognosis for Safe Diet Advancement Fair Barriers to Reach Goals Cognitive deficits Barriers/Prognosis Comment -- CHL IP DIET RECOMMENDATION 07/16/2015 SLP Diet Recommendations Nectar thick liquid;Dysphagia 1 (Puree) solids Liquid Administration via Cup Medication Administration Crushed with puree Compensations Slow rate;Small sips/bites;Follow solids with liquid Postural Changes Remain semi-upright after after feeds/meals (Comment)   CHL IP OTHER RECOMMENDATIONS 07/16/2015 Recommended Consults -- Oral Care Recommendations Oral care BID Other Recommendations Order thickener from pharmacy   CHL IP FOLLOW UP RECOMMENDATIONS 07/16/2015 Follow up Recommendations Home health SLP   CHL IP FREQUENCY AND DURATION 07/16/2015 Speech Therapy Frequency (ACUTE ONLY) min 2x/week Treatment Duration 1 week      CHL IP ORAL PHASE 07/16/2015 Oral Phase Impaired Oral - Pudding Teaspoon -- Oral - Pudding Cup -- Oral - Honey Teaspoon -- Oral - Honey Cup -- Oral - Nectar Teaspoon -- Oral - Nectar Cup Weak lingual manipulation;Lingual pumping;Reduced posterior propulsion;Premature spillage;Delayed oral transit Oral - Nectar Straw Weak lingual manipulation;Lingual pumping;Premature spillage;Delayed oral transit;Reduced posterior propulsion Oral - Thin Teaspoon Weak lingual manipulation;Lingual pumping;Reduced posterior propulsion;Premature spillage Oral - Thin Cup Weak lingual manipulation;Lingual pumping;Reduced posterior propulsion;Premature spillage Oral - Thin Straw Weak lingual manipulation;Lingual pumping;Reduced posterior propulsion;Delayed oral transit;Premature spillage Oral - Puree Weak lingual  manipulation;Lingual pumping;Premature spillage;Delayed oral transit;Reduced posterior propulsion Oral - Mech Soft -- Oral - Regular Weak lingual manipulation;Lingual pumping;Delayed oral transit;Impaired mastication;Reduced posterior propulsion;Premature spillage Oral - Multi-Consistency -- Oral - Pill -- Oral Phase - Comment --  CHL IP PHARYNGEAL PHASE 07/16/2015 Pharyngeal Phase Impaired Pharyngeal- Pudding Teaspoon -- Pharyngeal -- Pharyngeal- Pudding Cup -- Pharyngeal -- Pharyngeal- Honey Teaspoon -- Pharyngeal -- Pharyngeal- Honey Cup -- Pharyngeal -- Pharyngeal- Nectar Teaspoon Delayed swallow initiation-pyriform sinuses;Reduced pharyngeal peristalsis;Reduced epiglottic inversion;Pharyngeal residue - valleculae;Pharyngeal residue - pyriform Pharyngeal -- Pharyngeal- Nectar Cup Delayed swallow initiation-pyriform sinuses;Reduced pharyngeal peristalsis;Reduced epiglottic inversion;Pharyngeal residue - valleculae;Pharyngeal residue - pyriform;Penetration/Aspiration during swallow Pharyngeal Material enters airway, CONTACTS cords and not ejected out Pharyngeal- Nectar Straw Delayed swallow initiation-pyriform sinuses;Reduced pharyngeal peristalsis;Reduced epiglottic inversion;Pharyngeal residue - valleculae;Pharyngeal residue - pyriform;Penetration/Aspiration during swallow Pharyngeal Material enters airway, CONTACTS cords and not ejected out;Material enters airway, CONTACTS cords and then ejected out Pharyngeal- Thin Teaspoon Delayed swallow initiation-pyriform sinuses;Reduced pharyngeal peristalsis;Reduced epiglottic inversion;Pharyngeal residue - valleculae;Pharyngeal residue - pyriform Pharyngeal --  Pharyngeal- Thin Cup Delayed swallow initiation-pyriform sinuses;Reduced pharyngeal peristalsis;Reduced epiglottic inversion;Pharyngeal residue - valleculae;Pharyngeal residue - pyriform;Penetration/Aspiration during swallow Pharyngeal Material enters airway, CONTACTS cords and not ejected out Pharyngeal- Thin Straw  Delayed swallow initiation-pyriform sinuses;Reduced pharyngeal peristalsis;Reduced epiglottic inversion;Pharyngeal residue - valleculae;Pharyngeal residue - pyriform Pharyngeal -- Pharyngeal- Puree Delayed swallow initiation-vallecula;Reduced pharyngeal peristalsis;Reduced epiglottic inversion;Pharyngeal residue - valleculae Pharyngeal -- Pharyngeal- Mechanical Soft Delayed swallow initiation-vallecula;Reduced pharyngeal peristalsis;Reduced epiglottic inversion;Pharyngeal residue - valleculae Pharyngeal -- Pharyngeal- Regular -- Pharyngeal -- Pharyngeal- Multi-consistency -- Pharyngeal -- Pharyngeal- Pill -- Pharyngeal -- Pharyngeal Comment pt did not dry swallow on command nor cough likely due to his mentation  CHL IP CERVICAL ESOPHAGEAL PHASE 07/16/2015 Cervical Esophageal Phase Impaired Pudding Teaspoon -- Pudding Cup -- Honey Teaspoon -- Honey Cup -- Nectar Teaspoon -- Nectar Cup -- Nectar Straw -- Thin Teaspoon -- Thin Cup -- Thin Straw -- Puree -- Mechanical Soft -- Regular -- Multi-consistency -- Pill -- Cervical Esophageal Comment accumulation of barium at pyriform sinus mixed with secretions, esophageal sweep revealed clear distal esophagus  - radiologist not present to confirm No flowsheet data found. Janett Labella [place], [place] [facility] SLP [phone]               Time Spent in minutes  Parmer DO on 07/19/2015 at 2:10 PM   [email or web address] - password [facility]  [facility]

## 2015-07-19 NOTE — Progress Notes (Signed)
CSW continuing to follow.   PT evaluated pt yesterday and recommended SNF.   Pt admitted from [facility] memory care ALF and CSW spoke with [facility] memory care ALF representative today and representative came to evaluate pt in person. [facility] memory care ALF recommends that pt goes to rehab following hospitalization as [facility] ALF cannot manage the amount of care that pt requiring at this time.  CSW contacted pt son, Gerald Stabs via telephone to discuss. CSW discussed with pt son that [facility] ALF evaluated pt and feel pt needs higher level of care. CSW discussed with pt son that per [facility] memory care ALF that facility will be willing to evaluate pt again for return after pt has had stay at [facility]. Pt son expressed understanding. Pt son agreeable to SNF search in [place] and [facility]. CSW clarified pt sons questions regarding placement.  CSW completed FL2 and initiated SNF search to [facility] and [facility]. Pt has Blue Medicare which requires authorization prior to pt discharge to SNF.  CSW to follow up with pt son re: SNF bed offers.   CSW to continue to follow to assist with pt discharge to SNF.   Alison Murray, MSW, LCSW Clinical Social Work [employer] [phone]

## 2015-07-19 NOTE — Clinical Documentation Improvement (Signed)
Internal Medicine  Can the diagnosis of Shock be further specified? Please document response in next progress note. Thank you!   Shock, including Type:  Septic, Cardiogenic, Hypovolemic  Other Condition  Clinically Undetermined  Document any associated diagnoses/conditions.  Supporting Information:  BP's initially running: 85/52 with Map of 62,  78/51 with Map of 51  Fluids given; started on IV Vancomycin and Zosyn  Please exercise your independent, professional judgment when responding. A specific answer is not anticipated or expected.  Thank You,  Zoila Shutter RN, BSN, [place] [phone]; Cell: [phone]

## 2015-07-19 NOTE — Progress Notes (Signed)
Speech Language Pathology Treatment: Dysphagia  Patient Details Name: Roberto Campbell MRN: [id number] DOB: 12/24/1940 Today's Date: 07/19/2015 Time: [id number] SLP Time Calculation (min) (ACUTE ONLY): 11 min  Assessment / Plan / Recommendation Clinical Impression  Pt in bed, lethargic and thus did not provide po intake. Daughter states pt has been asleep since after his meal.  SLP educated daughter present *? Kim to findings of MBS using diagram providing clinical reasoning for diet modifications/aspiration precautions.  She reported understanding to information provided.   Encouraged daughter to research dysphagia/dementia/nutrition and educated her to waxing/waning swallow ability with pt's who have dementia and chronic medical issues.   Daughter reports pt consumed entire cup of pudding today with RN without coughing.  Nurse tech reports pt with poor appetite and cough x1 during meal.    Recommend continue Dysphagia 1/nectar diet with strict aspiration precautions.    Current level of dysphagia will impact pt's ability to obtain adequate nutrition.  Recommend consider palliative consult to help family/pt establish goals of care.   HPI HPI: 75 year old male admitted 07/12/15 due to AMS, decline over the past few weeks. PMH signiciant for bladder CA, dementia.  07/15/15 CXR could not rule out aspiration pna.  BSE completed with recommendations for dys1/nectar and MBS.        SLP Plan  Continue with current plan of care     Recommendations  Diet recommendations: Dysphagia 1 (puree);Nectar-thick liquid Liquids provided via: Cup Medication Administration: Crushed with puree (whole if small) Supervision: Full supervision/cueing for compensatory strategies;Staff to assist with self feeding Compensations: Slow rate;Small sips/bites;Follow solids with liquid (stop intake if pt coughing, check for oral residuals) Postural Changes and/or Swallow Maneuvers: Seated upright 90 degrees;Upright 30-60 min  after meal             Oral Care Recommendations: Oral care before and after PO Follow up Recommendations: 24 hour supervision/assistance;Other (comment) (at next venue of care) Plan: Continue with current plan of care     [place], [place] [facility] SLP [phone]

## 2015-07-19 NOTE — Care Management Important Message (Signed)
Important Message  Patient Details  Name: Roberto Campbell MRN: [id number] Date of Birth: August 11, 1940   Medicare Important Message Given:  Yes    [name] 07/19/2015, 10:30 AMImportant Message  Patient Details  Name: Roberto Campbell MRN: [id number] Date of Birth: 04-17-1940   Medicare Important Message Given:  Yes    [name] 07/19/2015, 10:30 AM

## 2015-07-20 DIAGNOSIS — J189 Pneumonia, unspecified organism: Secondary | ICD-10-CM

## 2015-07-20 DIAGNOSIS — N179 Acute kidney failure, unspecified: Secondary | ICD-10-CM | POA: Insufficient documentation

## 2015-07-20 DIAGNOSIS — E861 Hypovolemia: Secondary | ICD-10-CM | POA: Diagnosis present

## 2015-07-20 DIAGNOSIS — R7989 Other specified abnormal findings of blood chemistry: Secondary | ICD-10-CM

## 2015-07-20 DIAGNOSIS — F0391 Unspecified dementia with behavioral disturbance: Secondary | ICD-10-CM

## 2015-07-20 DIAGNOSIS — A419 Sepsis, unspecified organism: Principal | ICD-10-CM

## 2015-07-20 DIAGNOSIS — E870 Hyperosmolality and hypernatremia: Secondary | ICD-10-CM

## 2015-07-20 LAB — CBC
HEMATOCRIT: 32.2 % — AB (ref 39.0–52.0)
HEMOGLOBIN: 10.9 g/dL — AB (ref 13.0–17.0)
MCH: 29.5 pg (ref 26.0–34.0)
MCHC: 33.9 g/dL (ref 30.0–36.0)
MCV: 87.3 fL (ref 78.0–100.0)
Platelets: 188 10*3/uL (ref 150–400)
RBC: 3.69 MIL/uL — AB (ref 4.22–5.81)
RDW: 13 % (ref 11.5–15.5)
WBC: 7.2 10*3/uL (ref 4.0–10.5)

## 2015-07-20 LAB — BASIC METABOLIC PANEL
ANION GAP: 7 (ref 5–15)
BUN: 15 mg/dL (ref 6–20)
CHLORIDE: 110 mmol/L (ref 101–111)
CO2: 25 mmol/L (ref 22–32)
CREATININE: 1.31 mg/dL — AB (ref 0.61–1.24)
Calcium: 8.6 mg/dL — ABNORMAL LOW (ref 8.9–10.3)
GFR calc non Af Amer: 52 mL/min — ABNORMAL LOW (ref >60)
Glucose, Bld: 116 mg/dL — ABNORMAL HIGH (ref 65–99)
Potassium: 4.5 mmol/L (ref 3.5–5.1)
SODIUM: 142 mmol/L (ref 135–145)

## 2015-07-20 MED ORDER — AMOXICILLIN-POT CLAVULANATE 875-125 MG PO TABS: 1.0000 | ORAL_TABLET | Freq: Two times a day (BID) | ORAL | Status: DC

## 2015-07-20 MED ORDER — AMLODIPINE BESYLATE 5 MG PO TABS: 5.0000 mg | ORAL_TABLET | Freq: Every day | ORAL | Status: AC

## 2015-07-20 MED ORDER — FLUTICASONE PROPIONATE 50 MCG/ACT NA SUSP: 2.0000 | Freq: Every day | NASAL | Status: DC

## 2015-07-20 NOTE — Clinical Social Work Placement (Signed)
   CLINICAL SOCIAL WORK PLACEMENT  NOTE  Date:  07/20/2015  Patient Details  Name: Roberto Campbell MRN: [id number] Date of Birth: 04-21-1940  Clinical Social Work is seeking post-discharge placement for this patient at the [place] level of care (*CSW will initial, date and re-position this form in  chart as items are completed):  Yes   Patient/family provided with [place] Work Department's list of facilities offering this level of care within the geographic area requested by the patient (or if unable, by the patient's family).  Yes   Patient/family informed of their freedom to choose among providers that offer the needed level of care, that participate in Medicare, Medicaid or managed care program needed by the patient, have an available bed and are willing to accept the patient.  Yes   Patient/family informed of [place]'s ownership interest in [facility] and [facility], as well as of the fact that they are under no obligation to receive care at these facilities.  PASRR submitted to EDS on       PASRR number received on       Existing PASRR number confirmed on 07/19/15     FL2 transmitted to all facilities in geographic area requested by pt/family on 07/19/15     FL2 transmitted to all facilities within larger geographic area on       Patient informed that his/her managed care company has contracts with or will negotiate with certain facilities, including the following:        Yes   Patient/family informed of bed offers received.  Patient chooses bed at [facility]     Physician recommends and patient chooses bed at      Patient to be transferred to [facility] on 07/20/15.  Patient to be transferred to facility by ambulance Corey Harold)     Patient family notified on 07/20/15 of transfer.  Name of family member notified:  pt sons, [place] and Gerald Stabs notified via telephone     PHYSICIAN Please sign FL2,  Please sign DNR     Additional Comment:    _______________________________________________ Ladell Pier, LCSW 07/20/2015, 3:54 PM

## 2015-07-20 NOTE — Discharge Summary (Signed)
Physician Discharge Summary  Roberto Campbell [id number] DOB: 19-Oct-1940 DOA: 07/12/2015  PCP: No primary care provider on file.  Admit date: 07/12/2015 Discharge date: 07/20/2015  Time spent: 65 minutes  Recommendations for Outpatient Follow-up:  1. Patient was discharged to a skilled nursing facility. Patient will follow-up with M.D. at the skilled nursing facility. Patient's ACE inhibitor was discontinued secondary to acute renal failure. Patient will need a basic metabolic profile done in 1 week to follow-up on electrolytes and renal function. Patient will also need speech therapy follow-up at the facility. 2. If patient deteriorates or declines may benefit from a palliative care consultation at the facility.   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  Diet recommendation: Dysphagia 1 diet with nectar thick liquids. Aspiration precautions.  Filed Weights   07/12/15 0907  Weight: 95.255 kg (210 lb)    History of present illness:  Per Dr Monica Becton is an 75 y.o. male with a PMH of bladder cancer and dementia, sent from his nursing facility with a 2 week history of gradual decline in his usual condition. According to nursing home staff, he's not been eating or drinking well. He has not been getting out of bed. Patient is currently nonverbal and unable to provide any additional history. Upon initial evaluation in the ED, the patient was found to have marked elevation of his creatinine and hypotension, concerning for sepsis of a clear etiology. Blood pressure did respond to IV fluid challenge. Chest radiography was not done. While  admitting MD examined him, the patient was noted to have a congested cough. According to the patient's son, he has had a steady decline over the past 3 months. He was mobile up until 3 days ago. Doesn't always recognize family members  Hospital Course:  #1 sepsis secondary to HCAP Patient had presented with a gradual decline in his usual condition and noted to have a congested cough with decreased oral intake with worsening renal function and noted to be hypotensive on admission. Patient was pancultured and placed empirically on IV vancomycin and IV Zosyn. Urine cultures came back negative. Blood cultures with no growth to date. There was some concern for possible aspiration pneumonia due to patient's underlying dementia. Patient was evaluated by speech therapy will place patient on a dysphagia 1 diet. Patient was monitored placed on IV fluids and followed. Patient improved clinically and subsequently transitioned to oral Augmentin which he tolerated. Patient be discharged to skilled nursing facility with 2 more days of oral Augmentin to complete a ten-day course of antibiotic treatment. Patient be discharged in stable and improved condition.  #2 severe dehydration with acute renal failure and hypernatremia On admission patient was noted to be hypernatremic with dehydration clinically and in acute renal failure felt to be a prerenal azotemia secondary to poor oral intake in the setting of hypotension and ACE inhibitor. ACE inhibitor was held. Patient was hydrated with hypotonic IV fluids with improvement. Patient's creatinine on admission was 3.04 with a BUN of 79 and a sodium level of 157. Patient improved on a daily basis such that by day of discharge patient's creatinine was down to 1.31 with a sodium  level of 142 and a BUN of 15. Patient be discharged off his ACE inhibitor and will follow-up with M.D. at skilled nursing facility.  #3 hypotension On admission patient was noted to be hypotensive which  was felt to be likely multifactorial secondary to hypovolemia in the setting of antihypertensive medications and possibly sepsis. Patient was pancultured hydrated aggressively with IV fluids and antihypertensive medications held. Patient was also maintained on IV antibiotics. Patient's hypotension resolved by day of discharge.  #4 prolonged QTC Patient was noted to have a prolonged QTC on admission. Patient's Geodon was held. QTC prolongation at result by day of discharge.  #5 chronic asymptomatic bradycardia Remained stable.  #6 mild rise in troponin Felt to be secondary to demand ischemia secondary to sepsis. EKG had no ischemic changes. Patient denied any chest pain. Patient was maintained on home regimen aspirin.  #7 hypokalemia Repleted.  #8 dysphagia Likely secondary to her worsening dementia. Patient was seen in consultation by the speech therapist would recommended a dysphagia 1 diet with nectar thick liquids. Patient will need outpatient follow-up with speech therapy at the facility.  #9 dementia Remained stable.  #10 sacral pressure ulcer Patient underwent wound care during the hospitalization.   Procedures:  CXR 07/15/2015, 07/16/2015,07/19/2015  Consultations:  None  Discharge Exam: Filed Vitals:   07/19/15 2035 07/20/15 0515  BP: 141/72 134/64  Pulse: 73 55  Temp: 97.8 F (36.6 C) 97.8 F (36.6 C)  Resp: 20 20    General: NAD Cardiovascular: RRR Respiratory: CTAB  Discharge Instructions   Discharge Instructions    Diet general    Complete by:  As directed   Dysphagia 1 diet     Discharge instructions    Complete by:  As directed   Follow up with MD at SNF     Increase activity slowly    Complete by:  As directed           Current Discharge Medication List    START taking these medications   Details  amoxicillin-clavulanate (AUGMENTIN) 875-125 MG tablet Take 1 tablet by mouth every 12 (twelve) hours. Take for 2 days then stop. Qty: 4 tablet,  Refills: 0    fluticasone (FLONASE) 50 MCG/ACT nasal spray Place 2 sprays into both nostrils daily. Use for 4 days then stop. Qty: 16 g, Refills: 0      CONTINUE these medications which have CHANGED   Details  amLODipine (NORVASC) 5 MG tablet Take 1 tablet (5 mg total) by mouth daily. Qty: 30 tablet, Refills: 0      CONTINUE these medications which have NOT CHANGED   Details  carbamazepine (TEGRETOL) 100 MG chewable tablet Chew 100 mg by mouth 3 (three) times daily.    donepezil (ARICEPT) 10 MG tablet Take 10 mg by mouth at bedtime.    escitalopram (LEXAPRO) 20 MG tablet Take 20 mg by mouth daily.    ferrous sulfate 325 (65 FE) MG tablet Take 325 mg by mouth 2 (two) times daily with a meal.     hydrOXYzine (ATARAX/VISTARIL) 50 MG tablet Take 50 mg by mouth 2 (two) times daily.    memantine (NAMENDA) 10 MG tablet Take 10 mg by mouth 2 (two) times daily.    mirabegron ER (MYRBETRIQ) 50 MG TB24 tablet Take 50 mg by mouth every morning.    montelukast (SINGULAIR) 10 MG tablet Take 10 mg by mouth daily with breakfast.     Nutritional Supplements (NUTRITIONAL SHAKE) LIQD Take 118 mLs by mouth 3 (three)  times daily with meals. *Mighty Shakes*    omeprazole (PRILOSEC) 20 MG capsule Take 20 mg by mouth daily.    polyethylene glycol (MIRALAX / GLYCOLAX) packet Take 17 g by mouth daily.    selenium sulfide (SELSUN) 1 % LOTN Apply 1 application topically 2 (two) times a week. Apply 59ml to affected area of scalp and beard for 2 to 3 minutes and rinse thoroughly twice weekly    Skin Protectants, Misc. (EUCERIN) cream Apply 1 application topically 2 (two) times daily. To face.    tamsulosin (FLOMAX) 0.4 MG CAPS capsule Take 0.4 mg by mouth daily after breakfast.     traZODone (DESYREL) 100 MG tablet Take 100 mg by mouth at bedtime.      STOP taking these medications     lisinopril (PRINIVIL,ZESTRIL) 5 MG tablet      ziprasidone (GEODON) 20 MG capsule        Allergies  Allergen  Reactions  . Morphine And Related Swelling    Other reaction(s): Hallucinations  . Alprazolam     Other reaction(s): Other (See Comments) Nightmares  . Atorvastatin Other (See Comments)    Myalgias.   . Meperidine     Other reaction(s): Other (See Comments) Hallucinations   Follow-up Information    Please follow up.   Why:  f/u with MD at SNF       The results of significant diagnostics from this hospitalization (including imaging, microbiology, ancillary and laboratory) are listed below for reference.    Significant Diagnostic Studies: Ct Head Wo Contrast  07/12/2015  CLINICAL DATA:  Dementia, bladder cancer, altered mental status EXAM: CT HEAD WITHOUT CONTRAST TECHNIQUE: Contiguous axial images were obtained from the base of the skull through the vertex without intravenous contrast. COMPARISON:  05/06/2015 FINDINGS: Brain: No intracranial hemorrhage, mass effect or midline shift. No acute cortical infarction. No mass lesion is noted on this unenhanced scan. Moderate cerebral atrophy again noted. Stable chronic white matter disease. Vascular: Atherosclerotic calcifications of carotid siphon. Skull: Negative for fracture or focal lesion. Sinuses/Orbits: No acute findings. Other: None. IMPRESSION: No acute intracranial abnormality. Moderate cerebral atrophy again noted. Stable periventricular and patchy subcortical chronic white matter disease. No definite acute cortical infarction. Ventricular size is stable from prior exam. Electronically Signed   By: Lahoma Crocker M.D.   On: 07/12/2015 10:46   Dg Chest Port 1 View  07/19/2015  CLINICAL DATA:  Possible aspiration today, bladder cancer, hypertension, dementia EXAM: PORTABLE CHEST 1 VIEW COMPARISON:  Portable exam 1721 hours compared to 07/15/2015 FINDINGS: Normal heart size, mediastinal contours and pulmonary vascularity. Chronic elevation of LEFT diaphragm with LEFT basilar atelectasis. Progressive RIGHT upper lobe infiltrate. Remaining lungs  grossly clear. No pleural effusion or pneumothorax. IMPRESSION: Persistent LEFT basilar atelectasis with increased RIGHT upper lobe infiltrate most compatible with pneumonia. Electronically Signed   By: Lavonia Dana M.D.   On: 07/19/2015 17:41   Dg Chest Port 1 View  07/15/2015  CLINICAL DATA:  Pneumonia. EXAM: PORTABLE CHEST 1 VIEW COMPARISON:  07/12/2015 . FINDINGS: Mediastinum and hilar structures normal. Left lower lobe infiltrate noted consistent pneumonia. Mild right upper lobe and right base infiltrate. These findings are new. Small left pleural effusion cannot be excluded. No pneumothorax. Interposition of bowel under the left hemidiaphragm again noted . IMPRESSION: Prominent left lower lobe infiltrate. Mild right upper lobe and right lower lobe infiltrate. These findings are new from prior exam . Aspiration cannot be excluded. Small left pleural effusion cannot be excluded. Electronically Signed  By: [place]   On: 07/15/2015 07:24   Dg Chest Port 1 View  07/12/2015  CLINICAL DATA:  Sepsis EXAM: PORTABLE CHEST 1 VIEW COMPARISON:  05/07/2015 FINDINGS: Cardiac shadow is within normal limits. Elevation of left hemidiaphragm is noted and stable. Some mild increased density is noted in the right upper lobe which may represent early infiltrate. No bony abnormality is seen. IMPRESSION: Likely early infiltrate in the right upper lobe. Electronically Signed   By: Inez Catalina M.D.   On: 07/12/2015 14:35   Dg Swallowing Func-speech Pathology  07/16/2015  Objective Swallowing Evaluation: Type of Study: Bedside Swallow Evaluation Patient Details Name: Zoravar Cassis MRN: [id number] Date of Birth: 04/07/1941 Today's Date: 07/16/2015 Time: SLP Start Time (ACUTE ONLY): 1305-SLP Stop Time (ACUTE ONLY): 1340 SLP Time Calculation (min) (ACUTE ONLY): 35 min Past Medical History: Past Medical History Diagnosis Date . Dementia  . Bladder cancer ([place])  . HTN (hypertension)  . CKD (chronic kidney disease)  Past  Surgical History: Past Surgical History Procedure Laterality Date . Back surgery   . Minor hemorrhoidectomy   . Cholecystectomy   HPI: 75 year old male admitted 07/12/15 due to AMS, decline over the past few weeks. PMH signiciant for bladder CA, dementia.  07/15/15 CXR could not rule out aspiration pna.  BSE completed with recommendations for dys1/nectar and MBS.   Subjective: Pt resting in bed Assessment / Plan / Recommendation CHL IP CLINICAL IMPRESSIONS 07/16/2015 Therapy Diagnosis Moderate oral phase dysphagia;Moderate pharyngeal phase dysphagia Clinical Impression Moderate oropharyngeal sensormotor dysphagia characterized by delayed oral transiting and premature loss of boluses into pharynx due to poor lingual control.  Pharyngeal swallow was delayed to pyriform sinus with liquids and weakness resulted in residauls *worse with increased viscocity.  Pt did not dry swallow on command to faciliate clearance.  There was not significant aspiration but pt is at risk due to weakness and inability to follow directions.  Liquid consumption faciliated pharyngeal clearance of pudding/cracker.  At this time, due to pt's weakness, questionable aspiration, recommend continue dys1/nectar diet with strict precautions.  Skilled intervention included determining which compensation strategies were effective for pt.  SLP to follow for family education.   Impact on safety and function Moderate aspiration risk   CHL IP TREATMENT RECOMMENDATION 07/16/2015 Treatment Recommendations Therapy as outlined in treatment plan below   Prognosis 07/16/2015 Prognosis for Safe Diet Advancement Fair Barriers to Reach Goals Cognitive deficits Barriers/Prognosis Comment -- CHL IP DIET RECOMMENDATION 07/16/2015 SLP Diet Recommendations Nectar thick liquid;Dysphagia 1 (Puree) solids Liquid Administration via Cup Medication Administration Crushed with puree Compensations Slow rate;Small sips/bites;Follow solids with liquid Postural Changes Remain semi-upright  after after feeds/meals (Comment)   CHL IP OTHER RECOMMENDATIONS 07/16/2015 Recommended Consults -- Oral Care Recommendations Oral care BID Other Recommendations Order thickener from pharmacy   CHL IP FOLLOW UP RECOMMENDATIONS 07/16/2015 Follow up Recommendations Home health SLP   CHL IP FREQUENCY AND DURATION 07/16/2015 Speech Therapy Frequency (ACUTE ONLY) min 2x/week Treatment Duration 1 week      CHL IP ORAL PHASE 07/16/2015 Oral Phase Impaired Oral - Pudding Teaspoon -- Oral - Pudding Cup -- Oral - Honey Teaspoon -- Oral - Honey Cup -- Oral - Nectar Teaspoon -- Oral - Nectar Cup Weak lingual manipulation;Lingual pumping;Reduced posterior propulsion;Premature spillage;Delayed oral transit Oral - Nectar Straw Weak lingual manipulation;Lingual pumping;Premature spillage;Delayed oral transit;Reduced posterior propulsion Oral - Thin Teaspoon Weak lingual manipulation;Lingual pumping;Reduced posterior propulsion;Premature spillage Oral - Thin Cup Weak lingual manipulation;Lingual pumping;Reduced posterior propulsion;Premature spillage Oral -  Thin Straw Weak lingual manipulation;Lingual pumping;Reduced posterior propulsion;Delayed oral transit;Premature spillage Oral - Puree Weak lingual manipulation;Lingual pumping;Premature spillage;Delayed oral transit;Reduced posterior propulsion Oral - Mech Soft -- Oral - Regular Weak lingual manipulation;Lingual pumping;Delayed oral transit;Impaired mastication;Reduced posterior propulsion;Premature spillage Oral - Multi-Consistency -- Oral - Pill -- Oral Phase - Comment --  CHL IP PHARYNGEAL PHASE 07/16/2015 Pharyngeal Phase Impaired Pharyngeal- Pudding Teaspoon -- Pharyngeal -- Pharyngeal- Pudding Cup -- Pharyngeal -- Pharyngeal- Honey Teaspoon -- Pharyngeal -- Pharyngeal- Honey Cup -- Pharyngeal -- Pharyngeal- Nectar Teaspoon Delayed swallow initiation-pyriform sinuses;Reduced pharyngeal peristalsis;Reduced epiglottic inversion;Pharyngeal residue - valleculae;Pharyngeal residue -  pyriform Pharyngeal -- Pharyngeal- Nectar Cup Delayed swallow initiation-pyriform sinuses;Reduced pharyngeal peristalsis;Reduced epiglottic inversion;Pharyngeal residue - valleculae;Pharyngeal residue - pyriform;Penetration/Aspiration during swallow Pharyngeal Material enters airway, CONTACTS cords and not ejected out Pharyngeal- Nectar Straw Delayed swallow initiation-pyriform sinuses;Reduced pharyngeal peristalsis;Reduced epiglottic inversion;Pharyngeal residue - valleculae;Pharyngeal residue - pyriform;Penetration/Aspiration during swallow Pharyngeal Material enters airway, CONTACTS cords and not ejected out;Material enters airway, CONTACTS cords and then ejected out Pharyngeal- Thin Teaspoon Delayed swallow initiation-pyriform sinuses;Reduced pharyngeal peristalsis;Reduced epiglottic inversion;Pharyngeal residue - valleculae;Pharyngeal residue - pyriform Pharyngeal -- Pharyngeal- Thin Cup Delayed swallow initiation-pyriform sinuses;Reduced pharyngeal peristalsis;Reduced epiglottic inversion;Pharyngeal residue - valleculae;Pharyngeal residue - pyriform;Penetration/Aspiration during swallow Pharyngeal Material enters airway, CONTACTS cords and not ejected out Pharyngeal- Thin Straw Delayed swallow initiation-pyriform sinuses;Reduced pharyngeal peristalsis;Reduced epiglottic inversion;Pharyngeal residue - valleculae;Pharyngeal residue - pyriform Pharyngeal -- Pharyngeal- Puree Delayed swallow initiation-vallecula;Reduced pharyngeal peristalsis;Reduced epiglottic inversion;Pharyngeal residue - valleculae Pharyngeal -- Pharyngeal- Mechanical Soft Delayed swallow initiation-vallecula;Reduced pharyngeal peristalsis;Reduced epiglottic inversion;Pharyngeal residue - valleculae Pharyngeal -- Pharyngeal- Regular -- Pharyngeal -- Pharyngeal- Multi-consistency -- Pharyngeal -- Pharyngeal- Pill -- Pharyngeal -- Pharyngeal Comment pt did not dry swallow on command nor cough likely due to his mentation  CHL IP CERVICAL  ESOPHAGEAL PHASE 07/16/2015 Cervical Esophageal Phase Impaired Pudding Teaspoon -- Pudding Cup -- Honey Teaspoon -- Honey Cup -- Nectar Teaspoon -- Nectar Cup -- Nectar Straw -- Thin Teaspoon -- Thin Cup -- Thin Straw -- Puree -- Mechanical Soft -- Regular -- Multi-consistency -- Pill -- Cervical Esophageal Comment accumulation of barium at pyriform sinus mixed with secretions, esophageal sweep revealed clear distal esophagus  - radiologist not present to confirm No flowsheet data found. Janett Labella [place], [facility] SLP [phone]               Microbiology: Recent Results (from the past 240 hour(s))  Blood Culture (routine x 2)     Status: None   Collection Time: 07/12/15 10:00 AM  Result Value Ref Range Status   Specimen Description BLOOD RIGHT ANTECUBITAL  Final   Special Requests BOTTLES DRAWN AEROBIC AND ANAEROBIC 5ML  Final   Culture   Final    NO GROWTH 5 DAYS Performed at [facility]    Report Status 07/17/2015 FINAL  Final  Blood Culture (routine x 2)     Status: None   Collection Time: 07/12/15 10:06 AM  Result Value Ref Range Status   Specimen Description BLOOD RIGHT HAND  Final   Special Requests BOTTLES DRAWN AEROBIC ONLY 5ML  Final   Culture   Final    NO GROWTH 5 DAYS Performed at [facility]    Report Status 07/17/2015 FINAL  Final  Urine culture     Status: None   Collection Time: 07/12/15 10:51 AM  Result Value Ref Range Status   Specimen Description URINE, CATHETERIZED  Final   Special Requests NONE  Final   Culture  Final    NO GROWTH 1 DAY Performed at [facility]    Report Status 07/13/2015 FINAL  Final  MRSA PCR Screening     Status: None   Collection Time: 07/12/15  8:05 PM  Result Value Ref Range Status   MRSA by PCR NEGATIVE NEGATIVE Final    Comment:        The GeneXpert MRSA Assay (FDA approved for NASAL specimens only), is one component of a comprehensive MRSA colonization surveillance program. It is  not intended to diagnose MRSA infection nor to guide or monitor treatment for MRSA infections.   C difficile quick scan w PCR reflex     Status: None   Collection Time: 07/17/15 11:32 AM  Result Value Ref Range Status   C Diff antigen NEGATIVE NEGATIVE Final   C Diff toxin NEGATIVE NEGATIVE Final   C Diff interpretation Negative for toxigenic C. difficile  Final     Labs: Basic Metabolic Panel:  Recent Labs Lab 07/16/15 0544 07/17/15 0520 07/18/15 0531 07/19/15 0533 07/20/15 0509  NA 151* 149* 147* 145 142  K 3.1* 3.3* 3.9 4.4 4.5  CL 120* 118* 117* 114* 110  CO2 23 23 24 24 25   GLUCOSE 120* 125* 115* 112* 116*  BUN 41* 33* 24* 19 15  CREATININE 2.04* 1.72* 1.65* 1.27* 1.31*  CALCIUM 8.2* 8.1* 8.4* 8.5* 8.6*  MG  --   --  1.7  --   --    Liver Function Tests: No results for input(s): AST, ALT, ALKPHOS, BILITOT, PROT, ALBUMIN in the last 168 hours. No results for input(s): LIPASE, AMYLASE in the last 168 hours. No results for input(s): AMMONIA in the last 168 hours. CBC:  Recent Labs Lab 07/14/15 0530 07/15/15 0042 07/16/15 0544 07/18/15 0531 07/19/15 0533 07/20/15 0509  WBC 8.2 7.0 6.8  --  7.2 7.2  HGB 12.3* 11.5* 11.4*  --  11.8* 10.9*  HCT 37.7* 35.4* 34.3* 33.5* 34.9* 32.2*  MCV 94.0 93.2 92.0  --  90.4 87.3  PLT 117* 128* 125*  --  155 188   Cardiac Enzymes: No results for input(s): CKTOTAL, CKMB, CKMBINDEX, TROPONINI in the last 168 hours. BNP: BNP (last 3 results) No results for input(s): BNP in the last 8760 hours.  ProBNP (last 3 results) No results for input(s): PROBNP in the last 8760 hours.  CBG: No results for input(s): GLUCAP in the last 168 hours.     SignedIrine Seal MD.  [facility] 07/20/2015, 1:45 PM

## 2015-07-20 NOTE — Progress Notes (Signed)
RN Called report to nurse at [place] facility. Pt awaiting ambulance transportation.

## 2015-07-20 NOTE — Progress Notes (Signed)
Physical Therapy Treatment Patient Details Name: Roberto Campbell MRN: [id number] DOB: 1940-09-13 Today's Date: 07/20/2015    History of Present Illness 75 yo male admitted with sepsis. Hx of dementia, bladder cancer, HTN, CKD. Pt is from a memory care SNF.    PT Comments    Pt continues to require +2 assist for all mobility tasks. Weight shifted heavily in posterior direction with brief standing. Continue to recommend SNF.   Follow Up Recommendations  SNF     Equipment Recommendations  None recommended by PT    Recommendations for Other Services       Precautions / Restrictions Precautions Precautions: Fall Restrictions Weight Bearing Restrictions: No    Mobility  Bed Mobility Overal bed mobility: Needs Assistance Bed Mobility: Supine to Sit     Supine to sit: Total assist;+2 for physical assistance;+2 for safety/equipment;HOB elevated Sit to supine: Mod assist;+2 for physical assistance;+2 for safety/equipment   General bed mobility comments: Total assist to get to EOB-pt did not initiate or follow commands. Improvement in multimodal cues/commands and initiation of task for sit to supine.   Transfers Overall transfer level: Needs assistance Equipment used: 2 person hand held assist Transfers: Sit to/from Stand Sit to Stand: Max assist         General transfer comment: x3. Assist to rise, stabilzie, control descent. Pt would stand briefly before abruptly sitting each time. Weight shifted heavily in posterior direction.   Ambulation/Gait             General Gait Details: NT-pt unable   Stairs            Wheelchair Mobility    Modified Rankin (Stroke Patients Only)       Balance Overall balance assessment: Needs assistance   Sitting balance-Leahy Scale: Poor Sitting balance - Comments: Initially required Mod assist for static sitting balance but progress to Min guard assist with time.                             Cognition  Arousal/Alertness: Awake/alert Behavior During Therapy: Flat affect Overall Cognitive Status: History of cognitive impairments - at baseline Area of Impairment: Following commands       Following Commands: Follows one step commands inconsistently       General Comments: no verbal responses on today    Exercises      General Comments        Pertinent Vitals/Pain Pain Assessment: Faces Faces Pain Scale: No hurt    Home Living                      Prior Function            PT Goals (current goals can now be found in the care plan section) Progress towards PT goals: Progressing toward goals    Frequency  Min 2X/week    PT Plan Current plan remains appropriate    Co-evaluation             End of Session Equipment Utilized During Treatment: Gait belt Activity Tolerance: Patient tolerated treatment well Patient left: in bed;with call bell/phone within reach;with bed alarm set;with restraints reapplied     Time: [id number] PT Time Calculation (min) (ACUTE ONLY): 12 min  Charges:  $Therapeutic Activity: 8-22 mins                    G Codes:      Weston Anna,  MPT Pager: [phone]

## 2015-07-20 NOTE — Progress Notes (Signed)
CSW continuing to follow.   Pt for discharge today.  CSW has been in touch with pt sons, Gerald Stabs and [place] today. Pt sons choose bed at [facility].  CSW contacted facility and confirmed bed availability for today.   CSW faxed pt clinical information to [facility] for insurance authorization.   CSW received insurance authorization from [facility] [phone] #: [id number]). CSW confirmed with Rober Minion that facility had [employer] authorization.   CSW facilitated pt discharge needs including contacting facility, faxing pt discharge information via epic hub, notifying pt sons, Gerald Stabs and [place] via telephone about pt discharge, providing RN phone number to call report, and arranging ambulance transport for pt to [facility].  Pt family coping appropriately with transition to SNF.   No further social work needs identified at this time.  CSW signing off.   Alison Murray, MSW, LCSW Clinical Social Work 5E coverage [phone]

## 2015-07-20 NOTE — Progress Notes (Signed)
Speech Language Pathology Treatment: Dysphagia  Patient Details Name: Roberto Campbell MRN: [id number] DOB: [birth year] Today's Date: 07/20/2015 Time: [id number] SLP Time Calculation (min) (ACUTE ONLY): 12 min  Assessment / Plan / Recommendation Clinical Impression  Pt more alert today; ate portion of lunch and was assisted by this SLP with more of his meal. Not following commands nor communicating, but sufficiently alert to eat - masticated and swallowed purees with no overt s/s of aspiration, max cues to eliminate pocketing.  Lungs are clear per chart review. Education was completed yesterday with daughter, focusing on the fluctuating nature of dysphagia when associated with dementia.  Risk of recurrent aspiration cannot be eliminated, but will likely be minimized if pt remains on this diet (dysphagia 1, nectar-thick liquids), at least in the short-term.  Recommend SLP f/u once pt is D/Cd to SNF to re-evaluate swallow and determine any potential for upgrade.  No further acute care SLP needs are identified - our services will sign off.    HPI HPI: 75 year old male admitted 07/12/15 due to AMS, decline over the past few weeks. PMH signiciant for bladder CA, dementia.  07/15/15 CXR could not rule out aspiration pna.  BSE completed with recommendations for dys1/nectar and MBS.        SLP Plan  Discharge SLP treatment due to (comment)     Recommendations  Diet recommendations: Dysphagia 1 (puree);Nectar-thick liquid Liquids provided via: Cup Medication Administration: Crushed with puree Supervision: Full supervision/cueing for compensatory strategies;Staff to assist with self feeding Compensations: Slow rate;Small sips/bites;Follow solids with liquid (stop eating if coughing) Postural Changes and/or Swallow Maneuvers: Seated upright 90 degrees;Upright 30-60 min after meal             Oral Care Recommendations: Oral care before and after PO Follow up Recommendations: Skilled Nursing facility Plan:  Discharge SLP treatment due to (comment)     GO              Roberto Campbell L. Tivis Ringer, [place] CCC/SLP Pager [phone]   Roberto Campbell 07/20/2015, 11:45 AM

## 2015-07-22 ENCOUNTER — Encounter: Payer: Self-pay | Admitting: Internal Medicine

## 2015-07-22 ENCOUNTER — Non-Acute Institutional Stay (SKILLED_NURSING_FACILITY): Payer: Medicare Other | Admitting: Internal Medicine

## 2015-07-22 DIAGNOSIS — J189 Pneumonia, unspecified organism: Secondary | ICD-10-CM | POA: Diagnosis not present

## 2015-07-22 DIAGNOSIS — R1310 Dysphagia, unspecified: Secondary | ICD-10-CM | POA: Diagnosis not present

## 2015-07-22 DIAGNOSIS — F329 Major depressive disorder, single episode, unspecified: Secondary | ICD-10-CM

## 2015-07-22 DIAGNOSIS — F32A Depression, unspecified: Secondary | ICD-10-CM

## 2015-07-22 DIAGNOSIS — F0391 Unspecified dementia with behavioral disturbance: Secondary | ICD-10-CM | POA: Diagnosis not present

## 2015-07-22 DIAGNOSIS — I9589 Other hypotension: Secondary | ICD-10-CM | POA: Diagnosis not present

## 2015-07-22 DIAGNOSIS — A419 Sepsis, unspecified organism: Secondary | ICD-10-CM

## 2015-07-22 DIAGNOSIS — I4581 Long QT syndrome: Secondary | ICD-10-CM | POA: Diagnosis not present

## 2015-07-22 DIAGNOSIS — L8990 Pressure ulcer of unspecified site, unspecified stage: Secondary | ICD-10-CM

## 2015-07-22 DIAGNOSIS — N179 Acute kidney failure, unspecified: Secondary | ICD-10-CM

## 2015-07-22 DIAGNOSIS — R001 Bradycardia, unspecified: Secondary | ICD-10-CM | POA: Diagnosis not present

## 2015-07-22 DIAGNOSIS — E870 Hyperosmolality and hypernatremia: Secondary | ICD-10-CM

## 2015-07-22 DIAGNOSIS — R9431 Abnormal electrocardiogram [ECG] [EKG]: Secondary | ICD-10-CM

## 2015-07-22 DIAGNOSIS — F03918 Unspecified dementia, unspecified severity, with other behavioral disturbance: Secondary | ICD-10-CM

## 2015-07-22 NOTE — Progress Notes (Signed)
MRN: [id number] Name: Roberto Campbell  Sex: male Age: 75 y.o. DOB: 05-13-1940  [place] [facility] Facility/Room:114 Level Of Care: SNF Provider: Inocencio Homes D Emergency Contacts: Extended Emergency Contact Information Primary Emergency Contact: Demasi,Christopher Address: [address], [place] [address] Johnnette Litter of [employer] Phone: [phone] Relation: Son Secondary Emergency Contact: Kathlen [place] Mobile Phone: [phone] Relation: None  Code Status:   Allergies: Morphine and related; Alprazolam; Atorvastatin; and Meperidine  Chief Complaint  Patient presents with  . New Admit To SNF    HPI: Patient is 75 y.o. male with PMH of bladder cancer and dementia, sent from his nursing facility with a 2 week history of gradual decline in his usual condition. According to nursing home staff, he's not been eating or drinking well. Pt was admitted to [facility] form 3/28-4/5 where he was treated for sepsis with hypotension  2/2 HCAP, all complicated by  ARF and hypernatremia, hypokalemia and prolongued QT. Pt is admitted with SNF with generalized weakness for OT/PT. While at SNF pt will be followed for dementia, tx with aricept and namenda, HTN, tx with norvasc, and depression, tx with lexapro and trazodone.  Past Medical History  Diagnosis Date  . Dementia   . Bladder cancer ([place])   . HTN (hypertension)   . CKD (chronic kidney disease)     Past Surgical History  Procedure Laterality Date  . Back surgery    . Minor hemorrhoidectomy    . Cholecystectomy        Medication List       This list is accurate as of: 07/22/15 11:59 PM.  Always use your most recent med list.               amLODipine 5 MG tablet  Commonly known as:  NORVASC  Take 1 tablet (5 mg total) by mouth daily.     amoxicillin-clavulanate 875-125 MG tablet  Commonly known as:  AUGMENTIN  Take 1 tablet by mouth every 12 (twelve) hours. Take for 2 days  then stop.     carbamazepine 100 MG chewable tablet  Commonly known as:  TEGRETOL  Chew 100 mg by mouth 3 (three) times daily.     donepezil 10 MG tablet  Commonly known as:  ARICEPT  Take 10 mg by mouth at bedtime.     escitalopram 20 MG tablet  Commonly known as:  LEXAPRO  Take 20 mg by mouth daily.     eucerin cream  Apply 1 application topically 2 (two) times daily. To face.     ferrous sulfate 325 (65 FE) MG tablet  Take 325 mg by mouth 2 (two) times daily with a meal.     fluticasone 50 MCG/ACT nasal spray  Commonly known as:  FLONASE  Place 2 sprays into both nostrils daily. Use for 4 days then stop.     hydrOXYzine 50 MG tablet  Commonly known as:  ATARAX/VISTARIL  Take 50 mg by mouth 2 (two) times daily.     memantine 10 MG tablet  Commonly known as:  NAMENDA  Take 10 mg by mouth 2 (two) times daily.     montelukast 10 MG tablet  Commonly known as:  SINGULAIR  Take 10 mg by mouth daily with breakfast.     MYRBETRIQ 50 MG Tb24 tablet  Generic drug:  mirabegron ER  Take 50 mg by mouth every morning.     NUTRITIONAL  SHAKE Liqd  Take 118 mLs by mouth 3 (three) times daily with meals. *Mighty Shakes*     omeprazole 20 MG capsule  Commonly known as:  PRILOSEC  Take 20 mg by mouth daily.     polyethylene glycol packet  Commonly known as:  MIRALAX / GLYCOLAX  Take 17 g by mouth daily.     selenium sulfide 1 % Lotn  Commonly known as:  SELSUN  Apply 1 application topically 2 (two) times a week. Apply 71ml to affected area of scalp and beard for 2 to 3 minutes and rinse thoroughly twice weekly     tamsulosin 0.4 MG Caps capsule  Commonly known as:  FLOMAX  Take 0.4 mg by mouth daily after breakfast.     traZODone 100 MG tablet  Commonly known as:  DESYREL  Take 100 mg by mouth at bedtime.        No orders of the defined types were placed in this encounter.     There is no immunization history on file for this patient.  Social History  Substance  Use Topics  . Smoking status: Former Research scientist (life sciences)  . Smokeless tobacco: Not on file  . Alcohol Use: No    Family history is +CHF, CA   Review of Systems  UTO 2/2 dementia; nursing - no concerns    Filed Vitals:   07/22/15 1218  BP: 130/71  Pulse: 70  Temp: 97 F (36.1 C)  Resp: 18    SpO2 Readings from Last 1 Encounters:  07/20/15 98%        Physical Exam  GENERAL APPEARANCE: Alert, min conversant,  No acute distress.  SKIN: No diaphoresis rash HEAD: Normocephalic, atraumatic  EYES: Conjunctiva/lids clear. Pupils round, reactive. EOMs intact.  EARS: External exam WNL, canals clear. Hearing grossly normal.  NOSE: No deformity or discharge.  MOUTH/THROAT: Lips w/o lesions  RESPIRATORY: Breathing is even, unlabored. Lung sounds are clear   CARDIOVASCULAR: Heart RRR  3/6 systolic murmur, no rubs or gallops. No peripheral edema.   GASTROINTESTINAL: Abdomen is soft, non-tender, not distended w/ normal bowel sounds. GENITOURINARY: Bladder non tender, not distended  MUSCULOSKELETAL: No abnormal joints or musculature NEUROLOGIC:  Cranial nerves 2-12 grossly intact. Moves all extremities  PSYCHIATRIC: dementia, flat affect, no behavioral issues  Patient Active Problem List   Diagnosis Date Noted  . Hypotension 07/23/2015  . Prolonged QT interval 07/23/2015  . Sinus bradycardia, chronic 07/23/2015  . Dysphagia 07/23/2015  . Depression 07/23/2015  . Hypovolemia 07/20/2015  . Acute renal failure ([place])   . ARF (acute renal failure) ([place]) 07/12/2015  . Sepsis ([place]) 07/12/2015  . Hypernatremia 07/12/2015  . Dehydration 07/12/2015  . Thrombocytopenia ([place]) 07/12/2015  . Elevated troponin 07/12/2015  . 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 Pressure ulcer 07/12/2015  . HCAP (healthcare-associated pneumonia) 07/12/2015  . Dementia with behavioral disturbance 05/07/2015    CBC    Component Value Date/Time   WBC 7.2 07/20/2015 0509   RBC 3.69*  07/20/2015 0509   HGB 10.9* 07/20/2015 0509   HCT 32.2* 07/20/2015 0509   HCT 33.5* 07/18/2015 0531   PLT 188 07/20/2015 0509   MCV 87.3 07/20/2015 0509   LYMPHSABS 1.2 05/06/2015 2019   MONOABS 0.7 05/06/2015 2019   EOSABS 0.1 05/06/2015 2019   BASOSABS 0.0 05/06/2015 2019    CMP     Component Value Date/Time   NA 142 07/20/2015 0509   K 4.5 07/20/2015 0509   CL 110 07/20/2015  0509   CO2 25 07/20/2015 0509   GLUCOSE 116* 07/20/2015 0509   BUN 15 07/20/2015 0509   CREATININE 1.31* 07/20/2015 0509   CALCIUM 8.6* 07/20/2015 0509   PROT 7.0 07/12/2015 1005   ALBUMIN 2.9* 07/12/2015 1005   AST 92* 07/12/2015 1005   ALT 60 07/12/2015 1005   ALKPHOS 95 07/12/2015 1005   BILITOT 0.5 07/12/2015 1005   GFRNONAA 52* 07/20/2015 0509   GFRAA >60 07/20/2015 0509    No results found for: HGBA1C   Ct Head Wo Contrast  07/12/2015  CLINICAL DATA:  Dementia, bladder cancer, altered mental status EXAM: CT HEAD WITHOUT CONTRAST TECHNIQUE: Contiguous axial images were obtained from the base of the skull through the vertex without intravenous contrast. COMPARISON:  05/06/2015 FINDINGS: Brain: No intracranial hemorrhage, mass effect or midline shift. No acute cortical infarction. No mass lesion is noted on this unenhanced scan. Moderate cerebral atrophy again noted. Stable chronic white matter disease. Vascular: Atherosclerotic calcifications of carotid siphon. Skull: Negative for fracture or focal lesion. Sinuses/Orbits: No acute findings. Other: None. IMPRESSION: No acute intracranial abnormality. Moderate cerebral atrophy again noted. Stable periventricular and patchy subcortical chronic white matter disease. No definite acute cortical infarction. Ventricular size is stable from prior exam. Electronically Signed   By: Lahoma Crocker M.D.   On: 07/12/2015 10:46   Dg Chest Port 1 View  07/12/2015  CLINICAL DATA:  Sepsis EXAM: PORTABLE CHEST 1 VIEW COMPARISON:  05/07/2015 FINDINGS: Cardiac shadow is  within normal limits. Elevation of left hemidiaphragm is noted and stable. Some mild increased density is noted in the right upper lobe which may represent early infiltrate. No bony abnormality is seen. IMPRESSION: Likely early infiltrate in the right upper lobe. Electronically Signed   By: Inez Catalina M.D.   On: 07/12/2015 14:35    Not all labs, radiology exams or other studies done during hospitalization come through on my EPIC note; however they are reviewed by me.    Assessment and Plan  Sepsis [facility]) Patient had presented with a gradual decline in his usual condition and noted to have a congested cough with decreased oral intake with worsening renal function and noted to be hypotensive on admission. Patient was pancultured and placed empirically on IV vancomycin and IV Zosyn. Urine cultures came back negative. Blood cultures with no growth to date. There was some concern for possible aspiration pneumonia due to patient's underlying dementia. Patient was evaluated by speech therapy will place patient on a dysphagia 1 diet. Patient was monitored placed on IV fluids and followed. Patient improved clinically and subsequently transitioned to oral Augmentin which he tolerated. Patient be discharged to skilled nursing facility with 2 more days of oral Augmentin to complete a ten-day course of antibiotic treatment. Patient be discharged in stable and improved condition.   HCAP (healthcare-associated pneumonia)  Patient was pancultured and placed empirically on IV vancomycin and IV Zosyn. Concern for possible aspiration pneumonia due to patient's underlying dementia. Patient was evaluated by speech therapy will place patient on a dysphagia 1 diet. Patient was monitored placed on IV fluids and followed. Patient improved clinically and subsequently transitioned to oral Augmentin which he tolerated.  SNF -  2 more days of oral Augmentin to complete a ten-day course of antibiotic treatment.   Acute renal  failure ([place]) On admission patient was noted to be hypernatremic with dehydration clinically and in acute renal failure felt to be a prerenal azotemia secondary to poor oral intake in the setting of hypotension  and ACE inhibitor. ACE inhibitor was held. Patient was hydrated with hypotonic IV fluids with improvement. Patient's creatinine on admission was 3.04 with a BUN of 79 and a sodium level of 157. Patient improved on a daily basis such that by day of discharge patient's creatinine was down to 1.31 with a sodium level of 142 and a BUN of 15. P SNF - discharged off his ACE inhibitor and will follow-up with BMP    Hypernatremia On admission patient was noted to be hypernatremic with dehydration clinically. Patient was hydrated with hypotonic IV fluids with improvement. Patient's  sodium level of 157.  Day of discharge patient with a sodium level of 142 ; SNF - f/u BMP   Hypotension On admission patient was noted to be hypotensive which was felt to be likely multifactorial secondary to hypovolemia in the setting of antihypertensive medications and possibly sepsis. Patient was pancultured hydrated aggressively with IV fluids and antihypertensive medications held. Patient was also maintained on IV antibiotics. Patient's hypotension resolved by day of discharge. SNF - pt back on norvasc 5 mg and BP is good  Prolonged QT interval SNF - reported pt's geodon was d/c and QT corrected  Sinus bradycardia, chronic SNF - stable; monitor with daily VS  Pressure ulcer SNF - [facility]; wound care to follow  Dysphagia Likely secondary to her worsening dementia. Patient was seen in consultation by the speech therapist would recommended a dysphagia 1 diet with nectar thick liquids.  SNF -  follow-up with speech therapy atSNF   Depression SNF - not reported as unstale-cont lexapro and trazodone  Dementia with behavioral disturbance SNF - not reported as unstable; cont aricept and namenda   Time spent >  45 min;> 50% of time with patient was spent reviewing records, labs, tests and studies, counseling and developing plan of care  Hennie Duos, MD

## 2015-07-23 ENCOUNTER — Encounter: Payer: Self-pay | Admitting: Internal Medicine

## 2015-07-23 DIAGNOSIS — I959 Hypotension, unspecified: Secondary | ICD-10-CM | POA: Insufficient documentation

## 2015-07-23 DIAGNOSIS — F32A Depression, unspecified: Secondary | ICD-10-CM | POA: Insufficient documentation

## 2015-07-23 DIAGNOSIS — R1310 Dysphagia, unspecified: Secondary | ICD-10-CM | POA: Insufficient documentation

## 2015-07-23 DIAGNOSIS — R001 Bradycardia, unspecified: Secondary | ICD-10-CM | POA: Insufficient documentation

## 2015-07-23 DIAGNOSIS — R9431 Abnormal electrocardiogram [ECG] [EKG]: Secondary | ICD-10-CM | POA: Insufficient documentation

## 2015-07-23 DIAGNOSIS — F329 Major depressive disorder, single episode, unspecified: Secondary | ICD-10-CM | POA: Insufficient documentation

## 2015-07-23 NOTE — Assessment & Plan Note (Signed)
Patient was pancultured and placed empirically on IV vancomycin and IV Zosyn. Concern for possible aspiration pneumonia due to patient's underlying dementia. Patient was evaluated by speech therapy will place patient on a dysphagia 1 diet. Patient was monitored placed on IV fluids and followed. Patient improved clinically and subsequently transitioned to oral Augmentin which he tolerated.  SNF -  2 more days of oral Augmentin to complete a ten-day course of antibiotic treatment.

## 2015-07-23 NOTE — Assessment & Plan Note (Signed)
On admission patient was noted to be hypernatremic with dehydration clinically. Patient was hydrated with hypotonic IV fluids with improvement. Patient's  sodium level of 157.  Day of discharge patient with a sodium level of 142 ; SNF - f/u BMP

## 2015-07-23 NOTE — Assessment & Plan Note (Signed)
SNF - [facility]; wound care to follow

## 2015-07-23 NOTE — Assessment & Plan Note (Addendum)
On admission patient was noted to be hypotensive which was felt to be likely multifactorial secondary to hypovolemia in the setting of antihypertensive medications and possibly sepsis. Patient was pancultured hydrated aggressively with IV fluids and antihypertensive medications held. Patient was also maintained on IV antibiotics. Patient's hypotension resolved by day of discharge. SNF - pt back on norvasc 5 mg and BP is good

## 2015-07-23 NOTE — Assessment & Plan Note (Signed)
SNF - stable; monitor with daily VS

## 2015-07-23 NOTE — Assessment & Plan Note (Signed)
SNF - not reported as unstale-cont lexapro and trazodone

## 2015-07-23 NOTE — Assessment & Plan Note (Signed)
On admission patient was noted to be hypernatremic with dehydration clinically and in acute renal failure felt to be a prerenal azotemia secondary to poor oral intake in the setting of hypotension and ACE inhibitor. ACE inhibitor was held. Patient was hydrated with hypotonic IV fluids with improvement. Patient's creatinine on admission was 3.04 with a BUN of 79 and a sodium level of 157. Patient improved on a daily basis such that by day of discharge patient's creatinine was down to 1.31 with a sodium level of 142 and a BUN of 15. P SNF - discharged off his ACE inhibitor and will follow-up with BMP

## 2015-07-23 NOTE — Assessment & Plan Note (Signed)
SNF - reported pt's geodon was d/c and QT corrected

## 2015-07-23 NOTE — Assessment & Plan Note (Signed)
SNF - not reported as unstable; cont aricept and namenda

## 2015-07-23 NOTE — Assessment & Plan Note (Signed)
Patient had presented with a gradual decline in his usual condition and noted to have a congested cough with decreased oral intake with worsening renal function and noted to be hypotensive on admission. Patient was pancultured and placed empirically on IV vancomycin and IV Zosyn. Urine cultures came back negative. Blood cultures with no growth to date. There was some concern for possible aspiration pneumonia due to patient's underlying dementia. Patient was evaluated by speech therapy will place patient on a dysphagia 1 diet. Patient was monitored placed on IV fluids and followed. Patient improved clinically and subsequently transitioned to oral Augmentin which he tolerated. Patient be discharged to skilled nursing facility with 2 more days of oral Augmentin to complete a ten-day course of antibiotic treatment. Patient be discharged in stable and improved condition.

## 2015-07-23 NOTE — Assessment & Plan Note (Signed)
Likely secondary to her worsening dementia. Patient was seen in consultation by the speech therapist would recommended a dysphagia 1 diet with nectar thick liquids.  SNF -  follow-up with speech therapy atSNF

## 2015-07-29 LAB — BASIC METABOLIC PANEL
BUN: 12 mg/dL (ref 4–21)
CREATININE: 1.1 mg/dL (ref <=1.3)
Glucose: 82 mg/dL
Potassium: 3.8 mmol/L (ref 3.4–5.3)
Sodium: 138 mmol/L (ref 137–147)

## 2015-07-29 LAB — CBC AND DIFFERENTIAL
HCT: 30 % — AB (ref 41–53)
Hemoglobin: 9.9 g/dL — AB (ref 13.5–17.5)
PLATELETS: 256 10*3/uL (ref 150–399)
WBC: 4.3 10*3/mL

## 2015-08-09 ENCOUNTER — Encounter: Payer: Self-pay | Admitting: Internal Medicine

## 2015-08-15 ENCOUNTER — Encounter: Payer: Self-pay | Admitting: Internal Medicine

## 2015-08-15 ENCOUNTER — Non-Acute Institutional Stay (SKILLED_NURSING_FACILITY): Payer: Medicare Other | Admitting: Internal Medicine

## 2015-08-15 DIAGNOSIS — N179 Acute kidney failure, unspecified: Secondary | ICD-10-CM | POA: Diagnosis not present

## 2015-08-15 DIAGNOSIS — I9589 Other hypotension: Secondary | ICD-10-CM | POA: Diagnosis not present

## 2015-08-15 DIAGNOSIS — F0391 Unspecified dementia with behavioral disturbance: Secondary | ICD-10-CM | POA: Diagnosis not present

## 2015-08-15 DIAGNOSIS — Z8619 Personal history of other infectious and parasitic diseases: Secondary | ICD-10-CM | POA: Diagnosis not present

## 2015-08-15 DIAGNOSIS — F03918 Unspecified dementia, unspecified severity, with other behavioral disturbance: Secondary | ICD-10-CM

## 2015-08-15 NOTE — Progress Notes (Signed)
Patient ID: Draden Rumley, male   DOB: 12-07-1940, 75 y.o.   MRN: [id number] MRN: [id number] Name: Roberto Campbell  Sex: male Age: 75 y.o. DOB: 1940-09-27  [place] #: [facility] Facility/Room:114 Level Of Care: SNF Provider: Granville Lewis Emergency Contacts: Extended Emergency Contact Information Primary Emergency Contact: Rivenbark,Christopher Address: [address], [place] [address] Johnnette Litter of [place] Phone: [phone] Relation: Son Secondary Emergency Contact: Kathlen [place] of [place] Mobile Phone: [phone] Relation: None  Code Status:   Allergies: Morphine and related; Alprazolam; Atorvastatin; and Meperidine  Chief Complaint  Patient presents with  . Discharge Note    HPI: Patient is 75 y.o. male with PMH of bladder cancer and dementia, sent from his nursing facility with a 2 week history of gradual decline in his usual condition. According to nursing home staff,was not  eating or drinking well. Pt was admitted to [facility] form 3/28-4/5 where he was treated for sepsis with hypotension  2/2 HCAP, all complicated by  ARF and hypernatremia, hypokalemia and prolongued QT. Pt iwasadmitted with SNF with generalized weakness for OT/PT. While at SNF pt followed for dementia, tx with aricept and namenda, HTN, tx with norvasc, and depression, tx with lexapro and trazodone. Patient apparently is doing a bit better with his by mouth intake nursing does not report any acute issues-he does have wandering episodes with a history of dementia and he will be going to a secured unit at another skilled nursing facility.  Vital signs continued to be stable nursing staff does not report any acute concerns  Past Medical History  Diagnosis Date  . Dementia   . Bladder cancer ([place])   . HTN (hypertension)   . CKD (chronic kidney disease)     Past Surgical History  Procedure Laterality Date  . Back surgery    . Minor hemorrhoidectomy    . Cholecystectomy         Medication List       This list is accurate as of: 08/15/15 11:59 PM.  Always use your most recent med list.               AMBULATORY NON FORMULARY MEDICATION  MedPass: Give 4 oz three (3) times daily.     amLODipine 5 MG tablet  Commonly known as:  NORVASC  Take 1 tablet (5 mg total) by mouth daily.     carbamazepine 100 MG chewable tablet  Commonly known as:  TEGRETOL  Chew 100 mg by mouth 3 (three) times daily.     donepezil 10 MG tablet  Commonly known as:  ARICEPT  Take 10 mg by mouth at bedtime.     escitalopram 20 MG tablet  Commonly known as:  LEXAPRO  Take 20 mg by mouth daily.     eucerin cream  Apply 1 application topically 2 (two) times daily. To face.     feeding supplement (PRO-STAT SUGAR FREE 64) Liqd  Take 30 mLs by mouth daily.     ferrous sulfate 325 (65 FE) MG tablet  Take 325 mg by mouth 2 (two) times daily with a meal.     memantine 10 MG tablet  Commonly known as:  NAMENDA  Take 10 mg by mouth 2 (two) times daily.     montelukast 10 MG tablet  Commonly known as:  SINGULAIR  Take 10 mg by mouth daily with breakfast.     multivitamin tablet  Take 1 tablet by  mouth daily.     MYRBETRIQ 50 MG Tb24 tablet  Generic drug:  mirabegron ER  Take 50 mg by mouth every morning.     omeprazole 20 MG capsule  Commonly known as:  PRILOSEC  Take 20 mg by mouth daily.     polyethylene glycol packet  Commonly known as:  MIRALAX / GLYCOLAX  Take 17 g by mouth daily.     selenium sulfide 1 % Lotn  Commonly known as:  SELSUN  Apply 1 application topically 2 (two) times a week. Apply 51ml to affected area of scalp and beard for 2 to 3 minutes and rinse thoroughly twice weekly     tamsulosin 0.4 MG Caps capsule  Commonly known as:  FLOMAX  Take 0.4 mg by mouth daily after breakfast.        No orders of the defined types were placed in this encounter.    Immunization History  Administered Date(s) Administered  . PPD Test 07/20/2015     Social History  Substance Use Topics  . Smoking status: Former Research scientist (life sciences)  . Smokeless tobacco: Not on file  . Alcohol Use: No    Family history is +CHF, CA   Review of Systems  UTO 2/2 dementia; nursing - no concerns    Filed Vitals:   08/15/15 2024  BP: 137/64  Pulse: 60  Temp: 97 F (36.1 C)  Resp: 20    SpO2 Readings from Last 1 Encounters:  07/20/15 98%        Physical Exam  GENERAL APPEARANCE: Alert, min conversant,  No acute distress.  SKIN: No diaphoresis rash HEAD: Normocephalic, atraumatic  EYES: Conjunctiva/lids clear. Pupils round, reactive. EOMs intact.  EARS: External exam WNL, canals clear. Hearing grossly normal.  NOSE: No deformity or discharge.  MOUTH/THROAT: Zofran is clear mucous membranes appear fairly moist RESPIRATORY: Breathing is even, unlabored. Lung sounds are clear   CARDIOVASCULAR: Heart RRR  3/6 systolic murmur, no rubs or gallops. No peripheral edema.   GASTROINTESTINAL: Abdomen is soft, non-tender, not distended w/ normal bowel sounds. GENITOURINARY: Bladder non tender, not distended  MUSCULOSKELETAL: No abnormal joints or musculature he does stand and ambulate although somewhat unsteady NEUROLOGIC:  Cranial nerves 2-12 grossly intact. Moves all extremities  PSYCHIATRIC: dementia, flat affect, no behavioral issues oriented to self only  Patient Active Problem List   Diagnosis Date Noted  . Hypotension 07/23/2015  . Prolonged QT interval 07/23/2015  . Sinus bradycardia, chronic 07/23/2015  . Dysphagia 07/23/2015  . Depression 07/23/2015  . Hypovolemia 07/20/2015  . Acute renal failure ([place])   . ARF (acute renal failure) ([place]) 07/12/2015  . Sepsis ([place]) 07/12/2015  . Hypernatremia 07/12/2015  . Dehydration 07/12/2015  . Thrombocytopenia ([place]) 07/12/2015  . Elevated troponin 07/12/2015  . 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 Pressure ulcer 07/12/2015  . HCAP (healthcare-associated pneumonia)  07/12/2015  . Dementia with behavioral disturbance 05/07/2015  Labs.  08/04/2015.  Sodium 140 potassium 4 BUN 9 creatinine 1.11-.  Alkaline phosphatase 117.  Albumin 3.2-calcium 8.5-otherwise liver function tests within normal limits  08/05/2015.   WBC 7.1 hemoglobin 10.2 platelets 3:15.  Sodium 140 potassium 4.2 BUN 11 creatinine 1.28.    CBC    Component Value Date/Time   WBC 4.3 07/29/2015   WBC 7.2 07/20/2015 0509   RBC 3.69* 07/20/2015 0509   HGB 9.9* 07/29/2015   HCT 30* 07/29/2015   HCT 33.5* 07/18/2015 0531   PLT 256 07/29/2015   MCV 87.3 07/20/2015  0509   LYMPHSABS 1.2 05/06/2015 2019   MONOABS 0.7 05/06/2015 2019   EOSABS 0.1 05/06/2015 2019   BASOSABS 0.0 05/06/2015 2019    CMP     Component Value Date/Time   NA 138 07/29/2015   NA 142 07/20/2015 0509   K 3.8 07/29/2015   CL 110 07/20/2015 0509   CO2 25 07/20/2015 0509   GLUCOSE 116* 07/20/2015 0509   BUN 12 07/29/2015   BUN 15 07/20/2015 0509   CREATININE 1.1 07/29/2015   CREATININE 1.31* 07/20/2015 0509   CALCIUM 8.6* 07/20/2015 0509   PROT 7.0 07/12/2015 1005   ALBUMIN 2.9* 07/12/2015 1005   AST 92* 07/12/2015 1005   ALT 60 07/12/2015 1005   ALKPHOS 95 07/12/2015 1005   BILITOT 0.5 07/12/2015 1005   GFRNONAA 52* 07/20/2015 0509   GFRAA >60 07/20/2015 0509    No results found for: HGBA1C   Ct Head Wo Contrast  07/12/2015  CLINICAL DATA:  Dementia, bladder cancer, altered mental status EXAM: CT HEAD WITHOUT CONTRAST TECHNIQUE: Contiguous axial images were obtained from the base of the skull through the vertex without intravenous contrast. COMPARISON:  05/06/2015 FINDINGS: Brain: No intracranial hemorrhage, mass effect or midline shift. No acute cortical infarction. No mass lesion is noted on this unenhanced scan. Moderate cerebral atrophy again noted. Stable chronic white matter disease. Vascular: Atherosclerotic calcifications of carotid siphon. Skull: Negative for fracture or focal lesion.  Sinuses/Orbits: No acute findings. Other: None. IMPRESSION: No acute intracranial abnormality. Moderate cerebral atrophy again noted. Stable periventricular and patchy subcortical chronic white matter disease. No definite acute cortical infarction. Ventricular size is stable from prior exam. Electronically Signed   By: Lahoma Crocker M.D.   On: 07/12/2015 10:46   Dg Chest Port 1 View  07/12/2015  CLINICAL DATA:  Sepsis EXAM: PORTABLE CHEST 1 VIEW COMPARISON:  05/07/2015 FINDINGS: Cardiac shadow is within normal limits. Elevation of left hemidiaphragm is noted and stable. Some mild increased density is noted in the right upper lobe which may represent early infiltrate. No bony abnormality is seen. IMPRESSION: Likely early infiltrate in the right upper lobe. Electronically Signed   By: Inez Catalina M.D.   On: 07/12/2015 14:35    Not all labs, radiology exams or other studies done during hospitalization come through on my EPIC note; however they are reviewed by me.    Assessment and Plan  1 history of sepsis this appears to have resolved urine culture and blood cultures in the hospital were negative. There was some suspicion of aspiration pneumonia.  He has completed antibiotic treatment.  History of acute renal failure-with hypernatremia thought secondary to dehydration and acute renal failure.  ACE inhibitor was held he was hydrated with IVs.  Creatinine of 1.11 on recent lab appears to be baseline and certainly improved from hospital were was over 3.  #3-history hypertension this apparently resolved with treatment for suspected sepsis and hydration and holding ACE inhibitor he does continue on Norvasc 5 mg a day.  #4 history of prolonged QT interval this was in the hospital-patient skilled and was DC'd and QT corrected.  #5 history dysphasia apparently he is eating a bit better he was discharged from hospital with a dysphagia 1 diet with nectar thick liquids.  #6 history of dementia with  behavior disturbances he is on Aricept and Namenda again he does have wandering episodes and will be going to a secured unit.  #7 history depression he is on Lexapro and trazodone apparently this is been  relatively stable.  #8 history of anemia apparently an element of iron deficiency this appears stable with hemoglobin of 10.2 on lab done April 21.    The patient will be going to a secured unit at a skilled nursing facility-which is probably a better fit for him-at this point he clinically appears to be stable.  [id number] note greater than 30 minutes spent on this discharge summary-greater than 50% of time spent coordinating plan of care for numerous diagnoses     Granville Lewis,

## 2016-01-17 ENCOUNTER — Emergency Department (HOSPITAL_COMMUNITY)
Admission: EM | Admit: 2016-01-17 | Discharge: 2016-01-20 | Disposition: A | Discharge status: Other Acute Inpatient Hospital | Payer: Medicare Other | Attending: Emergency Medicine | Admitting: Emergency Medicine

## 2016-01-17 DIAGNOSIS — Z87891 Personal history of nicotine dependence: Secondary | ICD-10-CM | POA: Insufficient documentation

## 2016-01-17 DIAGNOSIS — Z8042 Family history of malignant neoplasm of prostate: Secondary | ICD-10-CM | POA: Diagnosis not present

## 2016-01-17 DIAGNOSIS — Z8551 Personal history of malignant neoplasm of bladder: Secondary | ICD-10-CM | POA: Diagnosis not present

## 2016-01-17 DIAGNOSIS — F0281 Dementia in other diseases classified elsewhere with behavioral disturbance: Secondary | ICD-10-CM | POA: Diagnosis not present

## 2016-01-17 DIAGNOSIS — N189 Chronic kidney disease, unspecified: Secondary | ICD-10-CM | POA: Diagnosis not present

## 2016-01-17 DIAGNOSIS — F918 Other conduct disorders: Secondary | ICD-10-CM | POA: Diagnosis present

## 2016-01-17 DIAGNOSIS — Z79899 Other long term (current) drug therapy: Secondary | ICD-10-CM | POA: Diagnosis not present

## 2016-01-17 DIAGNOSIS — R4689 Other symptoms and signs involving appearance and behavior: Secondary | ICD-10-CM

## 2016-01-17 DIAGNOSIS — F03918 Unspecified dementia, unspecified severity, with other behavioral disturbance: Secondary | ICD-10-CM | POA: Diagnosis present

## 2016-01-17 DIAGNOSIS — Z818 Family history of other mental and behavioral disorders: Secondary | ICD-10-CM | POA: Diagnosis not present

## 2016-01-17 DIAGNOSIS — F0391 Unspecified dementia with behavioral disturbance: Secondary | ICD-10-CM | POA: Diagnosis present

## 2016-01-17 LAB — CBC
HEMATOCRIT: 32.2 % — AB (ref 39.0–52.0)
Hemoglobin: 10.7 g/dL — ABNORMAL LOW (ref 13.0–17.0)
MCH: 30.7 pg (ref 26.0–34.0)
MCHC: 33.2 g/dL (ref 30.0–36.0)
MCV: 92.5 fL (ref 78.0–100.0)
PLATELETS: 153 10*3/uL (ref 150–400)
RBC: 3.48 MIL/uL — ABNORMAL LOW (ref 4.22–5.81)
RDW: 14.7 % (ref 11.5–15.5)
WBC: 5.6 10*3/uL (ref 4.0–10.5)

## 2016-01-17 LAB — COMPREHENSIVE METABOLIC PANEL
ALBUMIN: 3.7 g/dL (ref 3.5–5.0)
ALK PHOS: 86 U/L (ref 38–126)
ALT: 13 U/L — ABNORMAL LOW (ref 17–63)
AST: 17 U/L (ref 15–41)
Anion gap: 11 (ref 5–15)
BUN: 29 mg/dL — AB (ref 6–20)
CALCIUM: 8.8 mg/dL — AB (ref 8.9–10.3)
CHLORIDE: 109 mmol/L (ref 101–111)
CO2: 21 mmol/L — AB (ref 22–32)
CREATININE: 1.42 mg/dL — AB (ref 0.61–1.24)
GFR calc non Af Amer: 47 mL/min — ABNORMAL LOW (ref >60)
GFR, EST AFRICAN AMERICAN: 55 mL/min — AB (ref >60)
GLUCOSE: 97 mg/dL (ref 65–99)
Potassium: 4.3 mmol/L (ref 3.5–5.1)
SODIUM: 141 mmol/L (ref 135–145)
Total Bilirubin: 0.5 mg/dL (ref 0.3–1.2)
Total Protein: 6.8 g/dL (ref 6.5–8.1)

## 2016-01-17 LAB — RAPID URINE DRUG SCREEN, HOSP PERFORMED
AMPHETAMINES: NOT DETECTED
Barbiturates: NOT DETECTED
Benzodiazepines: NOT DETECTED
Cocaine: NOT DETECTED
OPIATES: NOT DETECTED
Tetrahydrocannabinol: NOT DETECTED

## 2016-01-17 LAB — URINALYSIS, ROUTINE W REFLEX MICROSCOPIC
Bilirubin Urine: NEGATIVE
Glucose, UA: NEGATIVE mg/dL
HGB URINE DIPSTICK: NEGATIVE
KETONES UR: NEGATIVE mg/dL
Leukocytes, UA: NEGATIVE
Nitrite: NEGATIVE
PROTEIN: NEGATIVE mg/dL
Specific Gravity, Urine: 1.015 (ref 1.005–1.030)
pH: 8.5 — ABNORMAL HIGH (ref 5.0–8.0)

## 2016-01-17 LAB — ETHANOL: Alcohol, Ethyl (B): <5 mg/dL (ref <5)

## 2016-01-17 LAB — CARBAMAZEPINE LEVEL, TOTAL

## 2016-01-17 MED ORDER — DARIFENACIN HYDROBROMIDE ER 7.5 MG PO TB24
7.5000 mg | ORAL_TABLET | Freq: Every day | ORAL | Status: DC
  Administered 2016-01-18 – 2016-01-19 (×2): 7.5 mg via ORAL
  Filled 2016-01-17 (×3): qty 1

## 2016-01-17 MED ORDER — CARBAMAZEPINE 100 MG PO CHEW
100.0000 mg | CHEWABLE_TABLET | Freq: Three times a day (TID) | ORAL | Status: DC
  Administered 2016-01-18 (×2): 100 mg via ORAL
  Filled 2016-01-17 (×3): qty 1

## 2016-01-17 MED ORDER — MONTELUKAST SODIUM 10 MG PO TABS
10.0000 mg | ORAL_TABLET | Freq: Every day | ORAL | Status: DC
  Administered 2016-01-18 – 2016-01-19 (×2): 10 mg via ORAL
  Filled 2016-01-17 (×4): qty 1

## 2016-01-17 MED ORDER — SODIUM CHLORIDE 0.9 % IV BOLUS (SEPSIS)
1000.0000 mL | Freq: Once | INTRAVENOUS | Status: AC
  Administered 2016-01-18: 1000 mL via INTRAVENOUS

## 2016-01-17 MED ORDER — DIPHENHYDRAMINE HCL 50 MG/ML IJ SOLN
25.0000 mg | Freq: Once | INTRAMUSCULAR | Status: AC
  Administered 2016-01-17: 25 mg via INTRAMUSCULAR
  Filled 2016-01-17: qty 1

## 2016-01-17 MED ORDER — MEMANTINE HCL 5 MG PO TABS
5.0000 mg | ORAL_TABLET | Freq: Two times a day (BID) | ORAL | Status: DC
  Administered 2016-01-18 – 2016-01-19 (×4): 5 mg via ORAL
  Filled 2016-01-17 (×6): qty 1

## 2016-01-17 MED ORDER — TAMSULOSIN HCL 0.4 MG PO CAPS
0.4000 mg | ORAL_CAPSULE | Freq: Every day | ORAL | Status: DC
  Administered 2016-01-18 – 2016-01-19 (×2): 0.4 mg via ORAL
  Filled 2016-01-17 (×3): qty 1

## 2016-01-17 MED ORDER — FERROUS SULFATE 325 (65 FE) MG PO TABS
325.0000 mg | ORAL_TABLET | Freq: Two times a day (BID) | ORAL | Status: DC
  Administered 2016-01-18 – 2016-01-19 (×3): 325 mg via ORAL
  Filled 2016-01-17 (×6): qty 1

## 2016-01-17 MED ORDER — AMLODIPINE BESYLATE 5 MG PO TABS
5.0000 mg | ORAL_TABLET | Freq: Every day | ORAL | Status: DC
  Administered 2016-01-18 – 2016-01-19 (×2): 5 mg via ORAL
  Filled 2016-01-17 (×2): qty 1

## 2016-01-17 MED ORDER — TRIAMCINOLONE ACETONIDE 0.5 % EX CREA
TOPICAL_CREAM | Freq: Two times a day (BID) | CUTANEOUS | Status: DC
  Administered 2016-01-18 – 2016-01-19 (×2): via TOPICAL
  Filled 2016-01-17 (×3): qty 15

## 2016-01-17 MED ORDER — DONEPEZIL HCL 5 MG PO TABS
10.0000 mg | ORAL_TABLET | Freq: Every day | ORAL | Status: DC
  Administered 2016-01-18 (×2): 10 mg via ORAL
  Filled 2016-01-17 (×2): qty 2

## 2016-01-17 NOTE — ED Notes (Signed)
Unable to assess patients BP at this due to restlessness. All other vs WNL

## 2016-01-17 NOTE — ED Notes (Signed)
Verbal order for Benadryl 25mg  IM given by Dr.Allen due to increased agitation and aggression.

## 2016-01-17 NOTE — ED Provider Notes (Addendum)
[facility] DEPT Provider Note   CSN: [id number] Arrival date & time: 01/17/16  1819     History   Chief Complaint Chief Complaint  Patient presents with  . IVC  . Aggressive Behavior    HPI Roberto Campbell is a 75 y.o. male.  75 year old male with history of dementia. Presents from Nursing home presents with increased agitation. Patient's history is obtained via EMS. Patient does have a history of depression and is on medications for this currently. EMS was called due to patient becoming increasing combative with staff as well as visitors. Patient transported here for further evaluation. No further history obtainable      Past Medical History:  Diagnosis Date  . Bladder cancer ([place])   . CKD (chronic kidney disease)   . Dementia   . HTN (hypertension)     Patient Active Problem List   Diagnosis Date Noted  . Hypotension 07/23/2015  . Prolonged QT interval 07/23/2015  . Sinus bradycardia, chronic 07/23/2015  . Dysphagia 07/23/2015  . Depression 07/23/2015  . Hypovolemia 07/20/2015  . Acute renal failure ([place])   . ARF (acute renal failure) ([place]) 07/12/2015  . Sepsis ([place]) 07/12/2015  . Hypernatremia 07/12/2015  . Dehydration 07/12/2015  . Thrombocytopenia ([place]) 07/12/2015  . Elevated troponin 07/12/2015  . 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 Pressure ulcer 07/12/2015  . HCAP (healthcare-associated pneumonia) 07/12/2015  . Dementia with behavioral disturbance 05/07/2015    Past Surgical History:  Procedure Laterality Date  . BACK SURGERY    . CHOLECYSTECTOMY    . MINOR HEMORRHOIDECTOMY         Home Medications    Prior to Admission medications   Medication Sig Start Date End Date Taking? Authorizing Provider  AMBULATORY NON FORMULARY MEDICATION Take 1 each by mouth 3 (three) times daily with meals. Supplement: Magic Cup 3 times daily with meals   Yes Historical Provider, MD  Amino Acids-Protein Hydrolys (FEEDING  SUPPLEMENT, PRO-STAT SUGAR FREE 64,) LIQD Take 30 mLs by mouth daily.   Yes Historical Provider, MD  amLODipine (NORVASC) 5 MG tablet Take 1 tablet (5 mg total) by mouth daily. 07/20/15  Yes Eugenie Filler, MD  augmented betamethasone dipropionate (DIPROLENE-AF) 0.05 % cream Apply 1 application topically 2 (two) times daily. Apply to face x 7 days starting 09/30 01/14/16  Yes Historical Provider, MD  carbamazepine (TEGRETOL) 100 MG chewable tablet Chew 100 mg by mouth 3 (three) times daily.   Yes Historical Provider, MD  Dextromethorphan-Quinidine (NUEDEXTA) 20-10 MG CAPS Take 1 capsule by mouth 2 (two) times daily.   Yes Historical Provider, MD  donepezil (ARICEPT) 10 MG tablet Take 10 mg by mouth at bedtime.   Yes Historical Provider, MD  escitalopram (LEXAPRO) 20 MG tablet Take 20 mg by mouth daily.   Yes Historical Provider, MD  ferrous sulfate 325 (65 FE) MG tablet Take 325 mg by mouth 2 (two) times daily with a meal.    Yes Historical Provider, MD  LORazepam (ATIVAN) 0.5 MG tablet Take 0.5 mg by mouth 2 (two) times daily.   Yes Historical Provider, MD  memantine (NAMENDA) 10 MG tablet Take 5 mg by mouth 2 (two) times daily.    Yes Historical Provider, MD  montelukast (SINGULAIR) 10 MG tablet Take 10 mg by mouth daily with breakfast.    Yes Historical Provider, MD  Multiple Vitamins-Minerals (DECUBI-VITE PO) Take 1 capsule by mouth every morning.   Yes Historical Provider, MD  omeprazole (PRILOSEC) 20 MG  capsule Take 20 mg by mouth daily.   Yes Historical Provider, MD  polyethylene glycol (MIRALAX / GLYCOLAX) packet Take 17 g by mouth daily.   Yes Historical Provider, MD  QUEtiapine (SEROQUEL) 50 MG tablet Take 50 mg by mouth 2 (two) times daily.   Yes Historical Provider, MD  Skin Protectants, Misc. (EUCERIN) cream Apply 1 application topically 2 (two) times daily. To face.   Yes Historical Provider, MD  solifenacin (VESICARE) 5 MG tablet Take 5 mg by mouth daily.   Yes Historical Provider, MD    tamsulosin (FLOMAX) 0.4 MG CAPS capsule Take 0.4 mg by mouth daily after breakfast.    Yes Historical Provider, MD    Family History Family History  Problem Relation Age of Onset  . Heart failure Father   . Heart failure Brother   . Cancer Father     Prostate    Social History Social History  Substance Use Topics  . Smoking status: Former Research scientist (life sciences)  . Smokeless tobacco: Not on file  . Alcohol use No     Allergies   Morphine and related; Alprazolam; Atorvastatin; and Meperidine   Review of Systems Review of Systems  Unable to perform ROS: Dementia     Physical Exam Updated Vital Signs BP 146/66 (BP Location: Right Arm)   Pulse 68   Temp 98.3 F (36.8 C) (Axillary)   Resp 16   SpO2 91%   Physical Exam  Constitutional: He appears well-developed and well-nourished.  Non-toxic appearance. No distress.  HENT:  Head: Normocephalic and atraumatic.  Eyes: Conjunctivae, EOM and lids are normal. Pupils are equal, round, and reactive to light.  Neck: Normal range of motion. Neck supple. No tracheal deviation present. No thyroid mass present.  Cardiovascular: Normal rate, regular rhythm and normal heart sounds.  Exam reveals no gallop.   No murmur heard. Pulmonary/Chest: Effort normal and breath sounds normal. No stridor. No respiratory distress. He has no decreased breath sounds. He has no wheezes. He has no rhonchi. He has no rales.  Abdominal: Soft. Normal appearance and bowel sounds are normal. He exhibits no distension. There is no tenderness. There is no rebound and no CVA tenderness.  Musculoskeletal: Normal range of motion. He exhibits no edema or tenderness.  Neurological: He is alert. He is disoriented. No cranial nerve deficit or sensory deficit. GCS eye subscore is 4. GCS verbal subscore is 5. GCS motor subscore is 6.  Patient moves all 4 extremities at this time  Skin: Skin is warm and dry. No abrasion and no rash noted.  Psychiatric: His affect is labile. His  speech is rapid and/or pressured. He is agitated.  Nursing note and vitals reviewed.    ED Treatments / Results  Labs (all labs ordered are listed, but only abnormal results are displayed) Labs Reviewed  CBC - Abnormal; Notable for the following:       Result Value   RBC 3.48 (*)    Hemoglobin 10.7 (*)    HCT 32.2 (*)    All other components within normal limits  COMPREHENSIVE METABOLIC PANEL  ETHANOL  URINE RAPID DRUG SCREEN, HOSP PERFORMED  CARBAMAZEPINE LEVEL, TOTAL    EKG  EKG Interpretation None       Radiology No results found.  Procedures Procedures (including critical care time)  Medications Ordered in ED Medications  diphenhydrAMINE (BENADRYL) injection 25 mg (25 mg Intramuscular Given 01/17/16 1943)     Initial Impression / Assessment and Plan / ED Course  I have  reviewed the triage vital signs and the nursing notes.  Pertinent labs & imaging results that were available during my care of the patient were reviewed by me and considered in my medical decision making (see chart for details).  Clinical Course  Patient's creatinine noted and will be given 1 L bolus of saline for possible dehydration. Given Benadryl 25 mg IM which is calm the patient down. Patient will require geropsychiatric placement 12:47 AM Patient medically cleared Final Clinical Impressions(s) / ED Diagnoses   Final diagnoses:  None    New Prescriptions New Prescriptions   No medications on file     Lacretia Leigh, MD 01/17/16 [place], MD 01/18/16 [phone]

## 2016-01-17 NOTE — ED Triage Notes (Signed)
Pt presents via [facility] after having Alzheimer's disease and becoming increasingly combative with staff and visitors. Pt is punching and very agitated. Alert.

## 2016-01-18 ENCOUNTER — Emergency Department (HOSPITAL_COMMUNITY): Payer: Medicare Other

## 2016-01-18 ENCOUNTER — Encounter (HOSPITAL_COMMUNITY): Payer: Self-pay | Admitting: *Deleted

## 2016-01-18 DIAGNOSIS — Z87891 Personal history of nicotine dependence: Secondary | ICD-10-CM

## 2016-01-18 DIAGNOSIS — Z8042 Family history of malignant neoplasm of prostate: Secondary | ICD-10-CM | POA: Diagnosis not present

## 2016-01-18 DIAGNOSIS — F0281 Dementia in other diseases classified elsewhere with behavioral disturbance: Secondary | ICD-10-CM

## 2016-01-18 DIAGNOSIS — Z818 Family history of other mental and behavioral disorders: Secondary | ICD-10-CM

## 2016-01-18 LAB — TSH: TSH: 3.473 u[IU]/mL (ref 0.350–4.500)

## 2016-01-18 MED ORDER — HALOPERIDOL LACTATE 5 MG/ML IJ SOLN
2.0000 mg | Freq: Once | INTRAMUSCULAR | Status: AC
  Administered 2016-01-18: 2 mg via INTRAVENOUS
  Filled 2016-01-18: qty 1

## 2016-01-18 MED ORDER — CARBAMAZEPINE 100 MG PO CHEW
200.0000 mg | CHEWABLE_TABLET | Freq: Two times a day (BID) | ORAL | Status: DC
  Administered 2016-01-18 – 2016-01-19 (×2): 200 mg via ORAL
  Filled 2016-01-18 (×5): qty 2

## 2016-01-18 NOTE — ED Notes (Signed)
meds crushed and given with applesauce. Patient continued to eat all of apple sauce.

## 2016-01-18 NOTE — ED Notes (Signed)
Patient remains calm at this time. Rt leg now released from restraints, sitter remains at bedside. Will continue to monitor
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
## 2016-01-18 NOTE — ED Notes (Signed)
Attempt at releasing restraints begun with right wrist. Sitter at bedside and pt calm at this time. Pt status still monitored.

## 2016-01-18 NOTE — BH Assessment (Signed)
[facility] Assessment Progress Note  Per Corena Pilgrim, MD, this pt requires psychiatric hospitalization at this time.  The following facilities have been contacted to seek placement for this pt, with results as noted:  Beds available, information sent, decision pending:  [facility]   Inappropriate for referral due to dementia:  [facility]   At capacity:  Felix Pacini [place], [place] Triage Specialist [phone]

## 2016-01-18 NOTE — ED Notes (Signed)
Patients final restraint (left wrist removed) patient remains calm but slightly agitated, rolling form side to side in bed. Sitter remains at bedside.

## 2016-01-18 NOTE — Progress Notes (Signed)
CSW received call from [employer] questioning patients ADL's. CSW answered these questions to the best of her ability. [facility] will let their doctor know and follow up if able to give bed offer.   Kingsley Spittle, [facility] Clinical Social Worker [phone]

## 2016-01-18 NOTE — ED Notes (Addendum)
Pt appears agitated trying to pull his mits off and pick at things. Randa Ngo is at the bedside. Son requested to speak with a case worker. Pts right forearm IV remains intact. Son stated , "under no condition will my dad go to [facility]. " Beverely Low, counselor, talking to the son and was made aware of the families wishes. Pt given a bed bath and diaper changed. Spoke with PA concerning pt has some reddness over some of his bony prominenes. She will order an air mattress for the pt. He does appear more calm now after getting a bath. Pt is positioned with pillows and turned q 2 hours. 10pm -Phoned Levada Dy at [employer] to get an overlay air mattress for the pt. Per Levada Dy it will be delivered in the am. 10:40pm _pt is asleep with regular respirations.Report to oncoming shift.

## 2016-01-18 NOTE — ED Notes (Signed)
Both legs now released from restraints. Patient still remains calm, sitter at bedside, continuing to monitor.

## 2016-01-18 NOTE — Progress Notes (Signed)
pt in [facility] ED from [facility] and rehab - Has been there since May 2017 and transferred to [facility] from [place] and rehab snf per snf forms from [place] listed as Roberto Campbell - on snf form, POA listed as son, [name] SAPPU progression discussion for bed search for geropsych ED RN for 01/18/16 also mentioned pt's present snf [facility] called 01/18/16 and states pt is eligible for return to [facility] snf

## 2016-01-18 NOTE — BH Assessment (Signed)
Assessment completed. Consulted with Patriciaann Clan, PA-C who recommends geropsych treatment with a re-evaluation in the morning by psychiatry to uphold or rescind the IVC.   Rosalin Hawking, LCSW Therapeutic Triage Specialist [place] 01/18/2016 2:57 AM

## 2016-01-19 LAB — URINE CULTURE: CULTURE: NO GROWTH

## 2016-01-19 MED ORDER — STERILE WATER FOR INJECTION IJ SOLN
INTRAMUSCULAR | Status: AC
  Administered 2016-01-19: 2 mL
  Filled 2016-01-19: qty 10

## 2016-01-19 MED ORDER — ZIPRASIDONE MESYLATE 20 MG IM SOLR
20.0000 mg | Freq: Once | INTRAMUSCULAR | Status: AC
  Administered 2016-01-19: 20 mg via INTRAMUSCULAR
  Filled 2016-01-19: qty 20

## 2016-01-19 MED ORDER — STERILE WATER FOR INJECTION IJ SOLN
INTRAMUSCULAR | Status: AC
  Filled 2016-01-19: qty 10

## 2016-01-19 MED ORDER — GABAPENTIN 100 MG PO CAPS
100.0000 mg | ORAL_CAPSULE | Freq: Two times a day (BID) | ORAL | Status: DC
  Administered 2016-01-19: 100 mg via ORAL
  Filled 2016-01-19: qty 1

## 2016-01-19 MED ORDER — ZIPRASIDONE MESYLATE 20 MG IM SOLR
10.0000 mg | Freq: Once | INTRAMUSCULAR | Status: AC
  Administered 2016-01-19: 10 mg via INTRAMUSCULAR
  Filled 2016-01-19: qty 20

## 2016-01-19 NOTE — ED Notes (Signed)
Report called to [facility] to [employer].

## 2016-01-19 NOTE — BH Assessment (Signed)
[facility] Assessment Progress Note   Clinician was asked by Roberto Moores, RN to talk with family.  Clinician spoke with POA Roberto Campbell (son) who is co POA with his brother Roberto Campbell.  Roberto Campbell's wife Roberto Campbell) is also on POA papers.  Roberto Campbell was informed of places that referrals had been made to.  Roberto was adamant that patient not go to [facility].  He said that patient went there about a year ago and "was not the same after he came out."  Clinician encouraged Roberto Campbell to follow up with [facility] to see where patient may be placed.  Roberto Campbell said that the POA paperwork should be on file since patient has been to [facility] other times in the past.

## 2016-01-19 NOTE — BH Assessment (Signed)
[facility] Assessment Progress Note  Per Corena Pilgrim, MD, this pt continues to require psychiatric hospitalization.  At 10:21 Darlene calls from [facility] to report that pt has been accepted to their facility by Dr Tresa Moore to [id number] [id number].  Dr Darleene Cleaver concurs with this decision.  Pt's nurse has been notified, and agrees to call report to [phone].  Pt is to be transported via [facility].  Jalene Mullet, [place] Triage Specialist [phone]

## 2016-01-19 NOTE — BH Assessment (Signed)
[place] [facility] Assessment Progress Note This writer attempted to evaluate patient this date although patient was drowsy and was not able to respond to this writer's questions. Collateral obtained from staff nurse stated patient was administered sedative medications earlier this date . Patient continues to meet criteria for an inpatient Gero-psych admission per [facility] MD.

## 2016-01-19 NOTE — ED Provider Notes (Signed)
4:37 PM  Nursing reports that patient has been accepted and is going to be transferred to [facility] for further psychiatric management.  Nursing expressed concern that patient was agitated this morning requiring IM Geodon and he may become agitated during transport.   PT was examined by me and he was moving his arms more than earlier. Nursing was concerned that patient would be too agitated for safe transfer. They requested agitation medicine to prevent injury in transfer.   Given need for patient's cooperation for transport, patient will have a lower dose of Geodon ordered prior to transfer.   EKG performed and QTc was not significantly prolonged. Patient had normal heart rate on assessment.  Patient nonverbal but did not appear to have any new complaints. Family agreed with no other concerns.   Patient will be transferred.   Courtney Paris, MD 01/19/16 2013
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
## 2016-01-19 NOTE — ED Notes (Signed)
Provider notified of patients restlessness and agitation, sitter has made many attempts to keep patient in bed. Provider stated he will review patient and order medication.

## 2016-01-19 NOTE — ED Notes (Signed)
Sheriff's department called to arrange transportation.

## 2016-01-19 NOTE — ED Provider Notes (Signed)
Patient is acting aggressively. No QTc prolongation on the 2 ekgs that were reviewed (10/4 and 10/3). We will give geodon 20 mg im - one of the newer antipsychotic over haldol for the agitation given the hx of prolonged QTc.    Varney Biles, MD 01/19/16 [phone]

## 2016-01-19 NOTE — ED Notes (Signed)
Sheriff called to say that he still could not give [place] a time on an arrival for transport of patient to [facility].

## 2016-03-12 ENCOUNTER — Other Ambulatory Visit: Payer: Self-pay

## 2016-03-12 ENCOUNTER — Encounter (HOSPITAL_COMMUNITY): Payer: Self-pay | Admitting: Emergency Medicine

## 2016-03-12 ENCOUNTER — Emergency Department (HOSPITAL_COMMUNITY)
Admission: EM | Admit: 2016-03-12 | Discharge: 2016-03-13 | Disposition: A | Discharge status: Home / Self Care | Payer: Medicare Other | Attending: Emergency Medicine | Admitting: Emergency Medicine

## 2016-03-12 DIAGNOSIS — Z8551 Personal history of malignant neoplasm of bladder: Secondary | ICD-10-CM | POA: Diagnosis not present

## 2016-03-12 DIAGNOSIS — F03918 Unspecified dementia, unspecified severity, with other behavioral disturbance: Secondary | ICD-10-CM | POA: Diagnosis present

## 2016-03-12 DIAGNOSIS — I129 Hypertensive chronic kidney disease with stage 1 through stage 4 chronic kidney disease, or unspecified chronic kidney disease: Secondary | ICD-10-CM | POA: Insufficient documentation

## 2016-03-12 DIAGNOSIS — F0391 Unspecified dementia with behavioral disturbance: Secondary | ICD-10-CM | POA: Insufficient documentation

## 2016-03-12 DIAGNOSIS — Z8042 Family history of malignant neoplasm of prostate: Secondary | ICD-10-CM | POA: Diagnosis not present

## 2016-03-12 DIAGNOSIS — N189 Chronic kidney disease, unspecified: Secondary | ICD-10-CM | POA: Insufficient documentation

## 2016-03-12 DIAGNOSIS — Z87891 Personal history of nicotine dependence: Secondary | ICD-10-CM | POA: Diagnosis not present

## 2016-03-12 DIAGNOSIS — Z79899 Other long term (current) drug therapy: Secondary | ICD-10-CM | POA: Diagnosis not present

## 2016-03-12 DIAGNOSIS — F918 Other conduct disorders: Secondary | ICD-10-CM | POA: Diagnosis present

## 2016-03-12 DIAGNOSIS — Z8249 Family history of ischemic heart disease and other diseases of the circulatory system: Secondary | ICD-10-CM | POA: Diagnosis not present

## 2016-03-12 DIAGNOSIS — Z888 Allergy status to other drugs, medicaments and biological substances status: Secondary | ICD-10-CM | POA: Diagnosis not present

## 2016-03-12 LAB — COMPREHENSIVE METABOLIC PANEL
ALBUMIN: 3.8 g/dL (ref 3.5–5.0)
ALT: 16 U/L — ABNORMAL LOW (ref 17–63)
ANION GAP: 8 (ref 5–15)
AST: 21 U/L (ref 15–41)
Alkaline Phosphatase: 88 U/L (ref 38–126)
BUN: 30 mg/dL — ABNORMAL HIGH (ref 6–20)
CO2: 25 mmol/L (ref 22–32)
Calcium: 8.6 mg/dL — ABNORMAL LOW (ref 8.9–10.3)
Chloride: 108 mmol/L (ref 101–111)
Creatinine, Ser: 1.04 mg/dL (ref 0.61–1.24)
GFR calc Af Amer: >60 mL/min (ref >60)
GFR calc non Af Amer: >60 mL/min (ref >60)
GLUCOSE: 96 mg/dL (ref 65–99)
POTASSIUM: 4 mmol/L (ref 3.5–5.1)
SODIUM: 141 mmol/L (ref 135–145)
TOTAL PROTEIN: 6.5 g/dL (ref 6.5–8.1)
Total Bilirubin: 0.7 mg/dL (ref 0.3–1.2)

## 2016-03-12 LAB — CBC WITH DIFFERENTIAL/PLATELET
BASOS PCT: 0 %
Basophils Absolute: 0 10*3/uL (ref 0.0–0.1)
Eosinophils Absolute: 0.1 10*3/uL (ref 0.0–0.7)
Eosinophils Relative: 2 %
HEMATOCRIT: 29.2 % — AB (ref 39.0–52.0)
HEMOGLOBIN: 9.6 g/dL — AB (ref 13.0–17.0)
LYMPHS ABS: 0.8 10*3/uL (ref 0.7–4.0)
Lymphocytes Relative: 12 %
MCH: 31.5 pg (ref 26.0–34.0)
MCHC: 32.9 g/dL (ref 30.0–36.0)
MCV: 95.7 fL (ref 78.0–100.0)
MONOS PCT: 10 %
Monocytes Absolute: 0.6 10*3/uL (ref 0.1–1.0)
NEUTROS ABS: 4.9 10*3/uL (ref 1.7–7.7)
NEUTROS PCT: 76 %
Platelets: 221 10*3/uL (ref 150–400)
RBC: 3.05 MIL/uL — ABNORMAL LOW (ref 4.22–5.81)
RDW: 14.7 % (ref 11.5–15.5)
WBC: 6.4 10*3/uL (ref 4.0–10.5)

## 2016-03-12 LAB — RAPID URINE DRUG SCREEN, HOSP PERFORMED
Amphetamines: NOT DETECTED
BARBITURATES: NOT DETECTED
Benzodiazepines: NOT DETECTED
COCAINE: NOT DETECTED
Opiates: NOT DETECTED
TETRAHYDROCANNABINOL: NOT DETECTED

## 2016-03-12 LAB — URINALYSIS, ROUTINE W REFLEX MICROSCOPIC
BILIRUBIN URINE: NEGATIVE
GLUCOSE, UA: NEGATIVE mg/dL
KETONES UR: NEGATIVE mg/dL
LEUKOCYTES UA: NEGATIVE
Nitrite: NEGATIVE
PROTEIN: NEGATIVE mg/dL
Specific Gravity, Urine: 1.018 (ref 1.005–1.030)
pH: 7 (ref 5.0–8.0)

## 2016-03-12 LAB — URINE MICROSCOPIC-ADD ON
Bacteria, UA: NONE SEEN
SQUAMOUS EPITHELIAL / LPF: NONE SEEN
WBC UA: NONE SEEN WBC/hpf (ref 0–5)

## 2016-03-12 LAB — CARBAMAZEPINE LEVEL, TOTAL

## 2016-03-12 LAB — ETHANOL: Alcohol, Ethyl (B): <5 mg/dL (ref <5)

## 2016-03-12 MED ORDER — DEXTROMETHORPHAN-QUINIDINE 20-10 MG PO CAPS: 1.0000 | ORAL_CAPSULE | Freq: Two times a day (BID) | ORAL | Status: DC

## 2016-03-12 MED ORDER — ZIPRASIDONE MESYLATE 20 MG IM SOLR
20.0000 mg | Freq: Once | INTRAMUSCULAR | Status: AC
  Administered 2016-03-12: 20 mg via INTRAMUSCULAR
  Filled 2016-03-12: qty 20

## 2016-03-12 MED ORDER — STERILE WATER FOR INJECTION IJ SOLN
INTRAMUSCULAR | Status: AC
  Administered 2016-03-12: 1.2 mL
  Filled 2016-03-12: qty 10

## 2016-03-12 MED ORDER — DONEPEZIL HCL 5 MG PO TABS: 10.0000 mg | ORAL_TABLET | Freq: Every day | ORAL | Status: DC

## 2016-03-12 MED ORDER — LORAZEPAM 0.5 MG PO TABS: 0.5000 mg | ORAL_TABLET | Freq: Two times a day (BID) | ORAL | Status: DC

## 2016-03-12 MED ORDER — POLYETHYLENE GLYCOL 3350 17 G PO PACK
17.0000 g | PACK | Freq: Every day | ORAL | Status: DC
  Administered 2016-03-13: 17 g via ORAL
  Filled 2016-03-12: qty 1

## 2016-03-12 MED ORDER — FERROUS SULFATE 325 (65 FE) MG PO TABS
325.0000 mg | ORAL_TABLET | Freq: Two times a day (BID) | ORAL | Status: DC
  Administered 2016-03-13: 325 mg via ORAL
  Filled 2016-03-12 (×3): qty 1

## 2016-03-12 MED ORDER — DARIFENACIN HYDROBROMIDE ER 7.5 MG PO TB24
7.5000 mg | ORAL_TABLET | Freq: Every day | ORAL | Status: DC
  Administered 2016-03-13: 7.5 mg via ORAL
  Filled 2016-03-12: qty 1

## 2016-03-12 MED ORDER — CARBAMAZEPINE 100 MG PO CHEW
100.0000 mg | CHEWABLE_TABLET | Freq: Two times a day (BID) | ORAL | Status: DC
  Administered 2016-03-12 – 2016-03-13 (×2): 100 mg via ORAL
  Filled 2016-03-12 (×2): qty 1

## 2016-03-12 MED ORDER — PANTOPRAZOLE SODIUM 40 MG PO TBEC
40.0000 mg | DELAYED_RELEASE_TABLET | Freq: Every day | ORAL | Status: DC
  Administered 2016-03-13: 40 mg via ORAL
  Filled 2016-03-12: qty 1

## 2016-03-12 MED ORDER — TAMSULOSIN HCL 0.4 MG PO CAPS
0.4000 mg | ORAL_CAPSULE | Freq: Every day | ORAL | Status: DC
  Administered 2016-03-13: 0.4 mg via ORAL
  Filled 2016-03-12: qty 1

## 2016-03-12 MED ORDER — ESCITALOPRAM OXALATE 10 MG PO TABS
5.0000 mg | ORAL_TABLET | Freq: Every morning | ORAL | Status: DC
  Administered 2016-03-13: 10 mg via ORAL
  Filled 2016-03-12: qty 1

## 2016-03-12 MED ORDER — BETAMETHASONE DIPROPIONATE AUG 0.05 % EX CREA
1.0000 "application " | TOPICAL_CREAM | Freq: Two times a day (BID) | CUTANEOUS | Status: DC
  Administered 2016-03-12: 1 via TOPICAL

## 2016-03-12 MED ORDER — OCUVITE-LUTEIN PO CAPS
1.0000 | ORAL_CAPSULE | Freq: Every day | ORAL | Status: DC
  Administered 2016-03-13: 1 via ORAL
  Filled 2016-03-12: qty 1

## 2016-03-12 MED ORDER — MEMANTINE HCL 5 MG PO TABS: 5.0000 mg | ORAL_TABLET | Freq: Two times a day (BID) | ORAL | Status: DC

## 2016-03-12 MED ORDER — AMLODIPINE BESYLATE 5 MG PO TABS
10.0000 mg | ORAL_TABLET | Freq: Every morning | ORAL | Status: DC
  Administered 2016-03-13: 10 mg via ORAL
  Filled 2016-03-12: qty 2

## 2016-03-12 MED ORDER — MONTELUKAST SODIUM 10 MG PO TABS
10.0000 mg | ORAL_TABLET | Freq: Every day | ORAL | Status: DC
  Administered 2016-03-13: 10 mg via ORAL
  Filled 2016-03-12: qty 1

## 2016-03-12 MED ORDER — LOXAPINE SUCCINATE 10 MG PO CAPS
10.0000 mg | ORAL_CAPSULE | Freq: Every day | ORAL | Status: DC
  Administered 2016-03-12: 10 mg via ORAL
  Filled 2016-03-12: qty 1

## 2016-03-12 MED ORDER — HYDROCERIN EX CREA
1.0000 "application " | TOPICAL_CREAM | Freq: Two times a day (BID) | CUTANEOUS | Status: DC
  Administered 2016-03-12 – 2016-03-13 (×2): 1 via TOPICAL
  Filled 2016-03-12: qty 113

## 2016-03-12 MED ORDER — LORAZEPAM 2 MG/ML IJ SOLN: 1.0000 mg | Freq: Once | INTRAMUSCULAR | Status: DC

## 2016-03-12 MED ORDER — LORAZEPAM 2 MG/ML IJ SOLN
1.0000 mg | Freq: Once | INTRAMUSCULAR | Status: AC
  Administered 2016-03-12: 1 mg via INTRAMUSCULAR
  Filled 2016-03-12: qty 1
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
## 2016-03-13 DIAGNOSIS — F0391 Unspecified dementia with behavioral disturbance: Secondary | ICD-10-CM

## 2016-03-13 DIAGNOSIS — Z8249 Family history of ischemic heart disease and other diseases of the circulatory system: Secondary | ICD-10-CM

## 2016-03-13 DIAGNOSIS — Z8042 Family history of malignant neoplasm of prostate: Secondary | ICD-10-CM

## 2016-03-13 DIAGNOSIS — Z888 Allergy status to other drugs, medicaments and biological substances status: Secondary | ICD-10-CM

## 2016-03-13 DIAGNOSIS — Z79899 Other long term (current) drug therapy: Secondary | ICD-10-CM

## 2016-03-13 MED ORDER — CARBAMAZEPINE 200 MG PO TABS
200.0000 mg | ORAL_TABLET | Freq: Two times a day (BID) | ORAL | Status: DC
  Administered 2016-03-13: 200 mg via ORAL
  Filled 2016-03-13: qty 1

## 2016-03-13 MED ORDER — QUETIAPINE FUMARATE 100 MG PO TABS: 100.0000 mg | ORAL_TABLET | Freq: Every day | ORAL | 0 refills | Status: DC

## 2016-03-13 MED ORDER — QUETIAPINE FUMARATE 100 MG PO TABS: 100.0000 mg | ORAL_TABLET | Freq: Every day | ORAL | Status: DC

## 2016-03-13 MED ORDER — CARBAMAZEPINE 100 MG PO CHEW: 200.0000 mg | CHEWABLE_TABLET | Freq: Two times a day (BID) | ORAL | Status: DC

## 2016-03-13 NOTE — Consult Note (Signed)
[facility] Consult   Reason for Consult:  Aggression  Referring Physician:  EDP Patient Identification: Roberto Campbell MRN:  [id number] Principal Diagnosis: Dementia with behavioral disturbance Diagnosis:   Patient Active Problem List   Diagnosis Date Noted  . Dementia with behavioral disturbance [F03.91] 05/07/2015    Priority: High  . Hypotension [I95.9] 07/23/2015  . Prolonged QT interval [R94.31] 07/23/2015  . Sinus bradycardia, chronic [R00.1] 07/23/2015  . Dysphagia [R13.10] 07/23/2015  . Depression [F32.9] 07/23/2015  . Hypovolemia [E86.1] 07/20/2015  . Acute renal failure ([place]) [N17.9]   . ARF (acute renal failure) ([place]) [N17.9] 07/12/2015  . Sepsis ([place]) [A41.9] 07/12/2015  . Hypernatremia [E87.0] 07/12/2015  . Dehydration [E86.0] 07/12/2015  . Thrombocytopenia ([place]) [D69.6] 07/12/2015  . Elevated troponin [R74.8] 07/12/2015  . 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 Pressure ulcer [L89.90] 07/12/2015  . HCAP (healthcare-associated pneumonia) [J18.9] 07/12/2015    Total Time spent with patient: 45 minutes  Subjective:   Roberto Campbell is a 75 y.o. male patient does not warrant admssion.  HPI:  75 yo male who was sent to the ED by his care home for aggression.  He has dementia and is nonverbal.  He has been calm and cooperative since last night.  [name] allowed care without issues, ate his breakfast.  Medications were adjusted and he stabilized.  Past Psychiatric History: dementia  Risk to Self: Suicidal Ideation:  (UTA) Suicidal Intent:  (UTA) Is patient at risk for suicide?:  (UTA) Suicidal Plan?:  (UTA) Access to Means:  (UTA) What has been your use of drugs/alcohol within the last 12 months?:  (UTa) How many times?:  (UTA) Other Self Harm Risks:  (UTA) Triggers for Past Attempts:  (UTA) Intentional Self Injurious Behavior:  (UTA) Risk to Others: Homicidal Ideation:  (UTA) Thoughts of Harm to Others:   (UTA) Current Homicidal Intent:  (UTA) Current Homicidal Plan:  (UTA) Access to Homicidal Means:  (UTa) Identified Victim:  (UTA) History of harm to others?:  (UTA) Assessment of Violence:  (UTA) Violent Behavior Description:  (currently calm and cooperative) Does patient have access to weapons?:  ([place]) Criminal Charges Pending?:  (UTA) Does patient have a court date:  (UTA) Prior Inpatient Therapy: Prior Inpatient Therapy: Yes Prior Therapy Dates: 2017 Prior Therapy Facilty/Provider(s): [place] [facility] Reason for Treatment: Dementia/Agitation Prior Outpatient Therapy: Prior Outpatient Therapy:  (UTA) Prior Therapy Dates: UTA Prior Therapy Facilty/Provider(s): UTA Reason for Treatment: UTA Does patient have an ACCT team?:  (UTA) Does patient have Intensive In-House Services?  :  (UTA) Does patient have [facility] services? :  (UTA) Does patient have P4CC services?:  (UTA)  Past Medical History:  Past Medical History:  Diagnosis Date  . Bladder cancer ([place])   . CKD (chronic kidney disease)   . Dementia   . HTN (hypertension)     Past Surgical History:  Procedure Laterality Date  . BACK SURGERY    . CHOLECYSTECTOMY    . MINOR HEMORRHOIDECTOMY     Family History:  Family History  Problem Relation Age of Onset  . Heart failure Father   . Cancer Father     Prostate  . Heart failure Brother    Family Psychiatric  History: none Social History:  History  Alcohol Use No     History  Drug use: Unknown    Social History   Social History  . Marital status: Divorced    Spouse name: N/A  . Number of children: 5  .  Years of education: N/A   Social History Main Topics  . Smoking status: Former Research scientist (life sciences)  . Smokeless tobacco: None  . Alcohol use No  . Drug use: Unknown  . Sexual activity: Not Asked   Other Topics Concern  . None   Social History Narrative   Divorced.  Resident of [facility].  Ambulates independently at baseline.     Additional Social  History:    Allergies:   Allergies  Allergen Reactions  . Morphine And Related Swelling    Other reaction(s): Hallucinations  . Alprazolam     Other reaction(s): Other (See Comments) Nightmares  . Atorvastatin Other (See Comments)    Myalgias.   . Meperidine     Other reaction(s): Other (See Comments) Hallucinations    Labs:  Results for orders placed or performed during the hospital encounter of 03/12/16 (from the past 48 hour(s))  Ethanol     Status: None   Collection Time: 03/12/16  3:30 PM  Result Value Ref Range   Alcohol, Ethyl (B) <5 <5 mg/dL    Comment:        LOWEST DETECTABLE LIMIT FOR SERUM ALCOHOL IS 5 mg/dL FOR MEDICAL PURPOSES ONLY   Carbamazepine level, total     Status: Abnormal   Collection Time: 03/12/16  3:30 PM  Result Value Ref Range   Carbamazepine Lvl <2.0 (L) 4.0 - 12.0 ug/mL    Comment: Performed at [facility]  Comprehensive metabolic panel     Status: Abnormal   Collection Time: 03/12/16  4:15 PM  Result Value Ref Range   Sodium 141 135 - 145 mmol/L   Potassium 4.0 3.5 - 5.1 mmol/L   Chloride 108 101 - 111 mmol/L   CO2 25 22 - 32 mmol/L   Glucose, Bld 96 65 - 99 mg/dL   BUN 30 (H) 6 - 20 mg/dL   Creatinine, Ser 1.04 0.61 - 1.24 mg/dL   Calcium 8.6 (L) 8.9 - 10.3 mg/dL   Total Protein 6.5 6.5 - 8.1 g/dL   Albumin 3.8 3.5 - 5.0 g/dL   AST 21 15 - 41 U/L   ALT 16 (L) 17 - 63 U/L   Alkaline Phosphatase 88 38 - 126 U/L   Total Bilirubin 0.7 0.3 - 1.2 mg/dL   GFR calc non Af Amer >60 >60 mL/min   GFR calc Af Amer >60 >60 mL/min    Comment: (NOTE) The eGFR has been calculated using the CKD EPI equation. This calculation has not been validated in all clinical situations. eGFR's persistently <60 mL/min signify possible Chronic Kidney Disease.    Anion gap 8 5 - 15  CBC with Diff     Status: Abnormal   Collection Time: 03/12/16  4:15 PM  Result Value Ref Range   WBC 6.4 4.0 - 10.5 K/uL   RBC 3.05 (L) 4.22 - 5.81 MIL/uL    Hemoglobin 9.6 (L) 13.0 - 17.0 g/dL   HCT 29.2 (L) 39.0 - 52.0 %   MCV 95.7 78.0 - 100.0 fL   MCH 31.5 26.0 - 34.0 pg   MCHC 32.9 30.0 - 36.0 g/dL   RDW 14.7 11.5 - 15.5 %   Platelets 221 150 - 400 K/uL   Neutrophils Relative % 76 %   Neutro Abs 4.9 1.7 - 7.7 K/uL   Lymphocytes Relative 12 %   Lymphs Abs 0.8 0.7 - 4.0 K/uL   Monocytes Relative 10 %   Monocytes Absolute 0.6 0.1 - 1.0  K/uL   Eosinophils Relative 2 %   Eosinophils Absolute 0.1 0.0 - 0.7 K/uL   Basophils Relative 0 %   Basophils Absolute 0.0 0.0 - 0.1 K/uL  Urine rapid drug screen (hosp performed)not at [facility]     Status: None   Collection Time: 03/12/16  8:33 PM  Result Value Ref Range   Opiates NONE DETECTED NONE DETECTED   Cocaine NONE DETECTED NONE DETECTED   Benzodiazepines NONE DETECTED NONE DETECTED   Amphetamines NONE DETECTED NONE DETECTED   Tetrahydrocannabinol NONE DETECTED NONE DETECTED   Barbiturates NONE DETECTED NONE DETECTED    Comment:        DRUG SCREEN FOR MEDICAL PURPOSES ONLY.  IF CONFIRMATION IS NEEDED FOR ANY PURPOSE, NOTIFY LAB WITHIN 5 DAYS.        LOWEST DETECTABLE LIMITS FOR URINE DRUG SCREEN Drug Class       Cutoff (ng/mL) Amphetamine      1000 Barbiturate      200 Benzodiazepine   081 Tricyclics       448 Opiates          300 Cocaine          300 THC              50   Urinalysis, Routine w reflex microscopic (not at [facility])     Status: Abnormal   Collection Time: 03/12/16  8:33 PM  Result Value Ref Range   Color, Urine YELLOW YELLOW   APPearance CLOUDY (A) CLEAR   Specific Gravity, Urine 1.018 1.005 - 1.030   pH 7.0 5.0 - 8.0   Glucose, UA NEGATIVE NEGATIVE mg/dL   Hgb urine dipstick LARGE (A) NEGATIVE   Bilirubin Urine NEGATIVE NEGATIVE   Ketones, ur NEGATIVE NEGATIVE mg/dL   Protein, ur NEGATIVE NEGATIVE mg/dL   Nitrite NEGATIVE NEGATIVE   Leukocytes, UA NEGATIVE NEGATIVE  Urine microscopic-add on     Status: None   Collection Time: 03/12/16  8:33 PM  Result Value Ref  Range   Squamous Epithelial / LPF NONE SEEN NONE SEEN   WBC, UA NONE SEEN 0 - 5 WBC/hpf   RBC / HPF TOO NUMEROUS TO COUNT 0 - 5 RBC/hpf   Bacteria, UA NONE SEEN NONE SEEN    Current Facility-Administered Medications  Medication Dose Route Frequency Provider Last Rate Last Dose  . amLODipine (NORVASC) tablet 10 mg  10 mg Oral q morning - 10a Patrecia Pour, NP   10 mg at 03/13/16 0834  . augmented betamethasone dipropionate (DIPROLENE-AF) 1.85 % cream 1 application  1 application Topical BID Patrecia Pour, NP   1 application at 63/14/97 2259  . [START ON 03/14/2016] carbamazepine (TEGRETOL) tablet 200 mg  200 mg Oral BID PC  , MD      . darifenacin (ENABLEX) 24 hr tablet 7.5 mg  7.5 mg Oral Daily Patrecia Pour, NP   7.5 mg at 03/13/16 0263  . escitalopram (LEXAPRO) tablet 5 mg  5 mg Oral q morning - 10a Patrecia Pour, NP   10 mg at 03/13/16 0839  . ferrous sulfate tablet 325 mg  325 mg Oral BID WC Patrecia Pour, NP   325 mg at 03/13/16 0835  . hydrocerin (EUCERIN) cream 1 application  1 application Topical BID Patrecia Pour, NP   1 application at 78/58/85 0840  . loxapine (LOXITANE) capsule 10 mg  10 mg Oral QHS Patrecia Pour, NP   10 mg at 03/12/16 2259  .  montelukast (SINGULAIR) tablet 10 mg  10 mg Oral Q breakfast Patrecia Pour, NP   10 mg at 03/13/16 0834  . multivitamin-lutein (OCUVITE-LUTEIN) capsule 1 capsule  1 capsule Oral Daily Patrecia Pour, NP   1 capsule at 03/13/16 0839  . pantoprazole (PROTONIX) EC tablet 40 mg  40 mg Oral Daily Patrecia Pour, NP   40 mg at 03/13/16 0837  . polyethylene glycol (MIRALAX / GLYCOLAX) packet 17 g  17 g Oral Daily Patrecia Pour, NP   17 g at 03/13/16 0841  . tamsulosin (FLOMAX) capsule 0.4 mg  0.4 mg Oral Q breakfast Patrecia Pour, NP   0.4 mg at 03/13/16 7741   Current Outpatient Prescriptions  Medication Sig Dispense Refill  . amLODipine (NORVASC) 10 MG tablet Take 10 mg by mouth every morning.    . carbamazepine  (TEGRETOL) 100 MG chewable tablet Chew 200 mg by mouth 2 (two) times daily.     Marland Kitchen escitalopram (LEXAPRO) 5 MG tablet Take 5 mg by mouth every morning.    Marland Kitchen LORazepam (ATIVAN) 0.5 MG tablet Take 0.5 mg by mouth once.    . loxapine (LOXITANE) 10 MG capsule Take 10 mg by mouth at bedtime.    . Melatonin 1 MG CAPS Take 1 mg by mouth at bedtime.    . montelukast (SINGULAIR) 10 MG tablet Take 10 mg by mouth daily with breakfast.     . omeprazole (PRILOSEC) 20 MG capsule Take 20 mg by mouth daily before breakfast.     . solifenacin (VESICARE) 5 MG tablet Take 5 mg by mouth every morning.     . tamsulosin (FLOMAX) 0.4 MG CAPS capsule Take 0.4 mg by mouth daily after breakfast.     . AMBULATORY NON FORMULARY MEDICATION Take 1 each by mouth 3 (three) times daily with meals. Supplement: Magic Cup 3 times daily with meals    . amLODipine (NORVASC) 5 MG tablet Take 1 tablet (5 mg total) by mouth daily. (Patient not taking: Reported on 03/12/2016) 30 tablet 0    Musculoskeletal: Strength & Muscle Tone: decreased Gait & Station: did not witness Patient leans: N/A  Psychiatric Specialty Exam: Physical Exam  Constitutional: He is oriented to person, place, and time. He appears well-developed and well-nourished.  HENT:  Head: Normocephalic.  Neck: Normal range of motion.  Respiratory: Effort normal.  Musculoskeletal: Normal range of motion.  Neurological: He is alert and oriented to person, place, and time.  Psychiatric: He has a normal mood and affect. His speech is normal and behavior is normal. Judgment and thought content normal. Cognition and memory are impaired.    Review of Systems  Constitutional: Negative.   HENT: Negative.   Eyes: Negative.   Respiratory: Negative.   Cardiovascular: Negative.   Gastrointestinal: Negative.   Genitourinary: Negative.   Musculoskeletal: Negative.   Skin: Negative.   Neurological: Negative.   Endo/Heme/Allergies: Negative.   Psychiatric/Behavioral:  Positive for memory loss.    Blood pressure 197/69, pulse 62, temperature 97.4 F (36.3 C), temperature source Axillary, resp. rate 14, SpO2 100 %.There is no height or weight on file to calculate BMI.  General Appearance: Casual  Eye Contact:  Good  Speech:  nonverbal  Volume:  nonverbal  Mood:  nonverbal, unable to assess  Affect:  Appropriate  Thought Process:  Nonverbal, unable to assess  Orientation:  Other:  unable to assess, nonverbal  Thought Content:  unable to assess, nonverbal  Suicidal Thoughts:  nonverbal, unable to  assess  Homicidal Thoughts:  nonverbal, unable to assess  Memory:  nonverbal, unable to assess  Judgement:  Fair  Insight:  nonverbal, unable to assess  Psychomotor Activity:  Normal  Concentration:  nonverbal, unable to assess  Recall: nonverbal, unable to assess  Fund of Knowledge:  nonverbal, unable to assess  Language:  nonverbal, unable to assess  Akathisia:  nonverbal, unable to assess  Handed:  Right  AIMS (if indicated):     Assets:  Housing Leisure Time Resilience  ADL's:  Impaired  Cognition:  Impaired,  Severe  Sleep:        Treatment Plan Summary: Daily contact with patient to assess and evaluate symptoms and progress in treatment, Medication management and Plan dementia with behavioral distubances:  -Crisis stabilization -Medication management:  Continued his medical medications.  Discontinued Ativan due to allergy alert.  Continued his Lexapro 5 mg daily for depression, Loxapine 10 mg at bedtime for sleep, and Tegretol 200 mg BID for mood and agitation.  Changed Seroquel 50 mg BID to 100 mg at bedtime for sleep and mood/aggressive behaviors -Individual counseling  Disposition: No evidence of imminent risk to self or others at present.    Waylan Boga, NP 03/13/2016 9:45 AM  Patient seen face-to-face for psychiatric evaluation, chart reviewed and case discussed with the physician extender and developed treatment plan. Reviewed the  information documented and agree with the treatment plan. Corena Pilgrim, MD

## 2016-03-13 NOTE — ED Notes (Signed)
Patient noted in room. No complaints, stable, in no acute distress. Will continue hourly rounding and sitter at bedside to keep patient safe.

## 2016-03-13 NOTE — [facility] Suicide Risk Assessment (Signed)
Suicide Risk Assessment  Discharge Assessment   [facility] Discharge Suicide Risk Assessment   Principal Problem: Dementia with behavioral disturbance Discharge Diagnoses:  Patient Active Problem List   Diagnosis Date Noted  . Dementia with behavioral disturbance [F03.91] 05/07/2015    Priority: High  . Hypotension [I95.9] 07/23/2015  . Prolonged QT interval [R94.31] 07/23/2015  . Sinus bradycardia, chronic [R00.1] 07/23/2015  . Dysphagia [R13.10] 07/23/2015  . Depression [F32.9] 07/23/2015  . Hypovolemia [E86.1] 07/20/2015  . Acute renal failure ([place]) [N17.9]   . ARF (acute renal failure) ([place]) [N17.9] 07/12/2015  . Sepsis ([place]) [A41.9] 07/12/2015  . Hypernatremia [E87.0] 07/12/2015  . Dehydration [E86.0] 07/12/2015  . Thrombocytopenia ([place]) [D69.6] 07/12/2015  . Elevated troponin [R74.8] 07/12/2015  . 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 Pressure ulcer [L89.90] 07/12/2015  . HCAP (healthcare-associated pneumonia) [J18.9] 07/12/2015    Total Time spent with patient: 45 minutes  Musculoskeletal: Strength & Muscle Tone: decreased Gait & Station: did not witness Patient leans: N/A  Psychiatric Specialty Exam: Physical Exam  Constitutional: He is oriented to person, place, and time. He appears well-developed and well-nourished.  HENT:  Head: Normocephalic.  Neck: Normal range of motion.  Respiratory: Effort normal.  Musculoskeletal: Normal range of motion.  Neurological: He is alert and oriented to person, place, and time.  Psychiatric: He has a normal mood and affect. His speech is normal and behavior is normal. Judgment and thought content normal. Cognition and memory are impaired.    Review of Systems  Constitutional: Negative.   HENT: Negative.   Eyes: Negative.   Respiratory: Negative.   Cardiovascular: Negative.   Gastrointestinal: Negative.   Genitourinary: Negative.   Musculoskeletal: Negative.   Skin: Negative.    Neurological: Negative.   Endo/Heme/Allergies: Negative.   Psychiatric/Behavioral: Positive for memory loss.    Blood pressure 197/69, pulse 62, temperature 97.4 F (36.3 C), temperature source Axillary, resp. rate 14, SpO2 100 %.There is no height or weight on file to calculate BMI.  General Appearance: Casual  Eye Contact:  Good  Speech:  nonverbal  Volume:  nonverbal  Mood:  nonverbal, unable to assess  Affect:  Appropriate  Thought Process:  Nonverbal, unable to assess  Orientation:  Other:  unable to assess, nonverbal  Thought Content:  unable to assess, nonverbal  Suicidal Thoughts:  nonverbal, unable to assess  Homicidal Thoughts:  nonverbal, unable to assess  Memory:  nonverbal, unable to assess  Judgement:  Fair  Insight:  nonverbal, unable to assess  Psychomotor Activity:  Normal  Concentration:  nonverbal, unable to assess  Recall: nonverbal, unable to assess  Fund of Knowledge:  nonverbal, unable to assess  Language:  nonverbal, unable to assess  Akathisia:  nonverbal, unable to assess  Handed:  Right  AIMS (if indicated):     Assets:  Housing Leisure Time Resilience  ADL's:  Impaired  Cognition:  Impaired,  Severe  Sleep:      Mental Status Per Nursing Assessment::   On Admission:   aggressive behaviors  Demographic Factors:  Male and Age 74 or older, caucasian  Loss Factors: NA  Historical Factors: Impulsivity  Risk Reduction Factors:   Living with another person, especially a relative and Positive social support  Continued Clinical Symptoms:  None  Cognitive Features That Contribute To Risk:  Nonverbal, unable to assess  Suicide Risk:  Minimal: No identifiable suicidal ideation.  Patients presenting with no risk factors but with morbid ruminations; may be classified as minimal  risk based on the severity of the depressive symptoms    Plan Of Care/Follow-up recommendations:  Activity:  as tolerated Diet:  heart healthy diet  LORD,  JAMISON, NP 03/13/2016, 9:57 AM

## 2016-03-13 NOTE — Progress Notes (Signed)
CSW was informed patient had been psychiatrically cleared and ready discharge. Call made to [facility], Admissions at [facility]. She stated she would call back once she speaks with the DON.   10:45am- Call received back from Terry and she stated someone will be here to pick up patient around two and half hours. Psychiatrist, NP, Nurse, and Psychiatry Team updated.   Call received from [place], [place] with [facility] to speak with someone in bookkeeping at the hospital. CSW provided the main number for her to make contact.    Merry Proud, LCSWA Clinical Social Worker [phone]  11:30 AM

## 2016-03-13 NOTE — ED Notes (Signed)
Spoke with MD concerning patient's BP.  He wants [place] to give the 10am Norvasc at 8am.

## 2016-04-09 ENCOUNTER — Emergency Department (HOSPITAL_COMMUNITY)
Admission: EM | Admit: 2016-04-09 | Discharge: 2016-04-09 | Disposition: A | Discharge status: Home / Self Care | Payer: Medicare Other | Attending: Emergency Medicine | Admitting: Emergency Medicine

## 2016-04-09 ENCOUNTER — Emergency Department (HOSPITAL_COMMUNITY): Payer: Medicare Other

## 2016-04-09 ENCOUNTER — Encounter (HOSPITAL_COMMUNITY): Payer: Self-pay | Admitting: Emergency Medicine

## 2016-04-09 DIAGNOSIS — S0990XA Unspecified injury of head, initial encounter: Secondary | ICD-10-CM | POA: Diagnosis present

## 2016-04-09 DIAGNOSIS — Z87891 Personal history of nicotine dependence: Secondary | ICD-10-CM | POA: Insufficient documentation

## 2016-04-09 DIAGNOSIS — S0181XA Laceration without foreign body of other part of head, initial encounter: Secondary | ICD-10-CM

## 2016-04-09 DIAGNOSIS — I129 Hypertensive chronic kidney disease with stage 1 through stage 4 chronic kidney disease, or unspecified chronic kidney disease: Secondary | ICD-10-CM | POA: Insufficient documentation

## 2016-04-09 DIAGNOSIS — Z8551 Personal history of malignant neoplasm of bladder: Secondary | ICD-10-CM | POA: Diagnosis not present

## 2016-04-09 DIAGNOSIS — N189 Chronic kidney disease, unspecified: Secondary | ICD-10-CM | POA: Insufficient documentation

## 2016-04-09 DIAGNOSIS — Y939 Activity, unspecified: Secondary | ICD-10-CM | POA: Diagnosis not present

## 2016-04-09 DIAGNOSIS — Y92129 Unspecified place in nursing home as the place of occurrence of the external cause: Secondary | ICD-10-CM

## 2016-04-09 DIAGNOSIS — Z79899 Other long term (current) drug therapy: Secondary | ICD-10-CM | POA: Diagnosis not present

## 2016-04-09 DIAGNOSIS — W1830XA Fall on same level, unspecified, initial encounter: Secondary | ICD-10-CM | POA: Diagnosis not present

## 2016-04-09 DIAGNOSIS — Y999 Unspecified external cause status: Secondary | ICD-10-CM | POA: Insufficient documentation

## 2016-04-09 DIAGNOSIS — W19XXXA Unspecified fall, initial encounter: Secondary | ICD-10-CM

## 2016-04-09 DIAGNOSIS — Y9289 Other specified places as the place of occurrence of the external cause: Secondary | ICD-10-CM | POA: Diagnosis not present

## 2016-04-09 MED ORDER — LORAZEPAM 1 MG PO TABS
1.0000 mg | ORAL_TABLET | Freq: Once | ORAL | Status: AC
  Administered 2016-04-09: 1 mg via ORAL
  Filled 2016-04-09: qty 1

## 2016-04-09 MED ORDER — HALOPERIDOL LACTATE 5 MG/ML IJ SOLN: 2.0000 mg | Freq: Once | INTRAMUSCULAR | Status: DC

## 2016-04-09 MED ORDER — HALOPERIDOL 2 MG PO TABS
2.0000 mg | ORAL_TABLET | Freq: Once | ORAL | Status: AC
  Administered 2016-04-09: 2 mg via ORAL
  Filled 2016-04-09: qty 1

## 2016-04-09 MED ORDER — LIDOCAINE-EPINEPHRINE-TETRACAINE (LET) SOLUTION
3.0000 mL | Freq: Once | NASAL | Status: AC
  Administered 2016-04-09: 3 mL via TOPICAL
  Filled 2016-04-09: qty 3

## 2016-04-09 MED ORDER — LIDOCAINE HCL (PF) 1 % IJ SOLN
5.0000 mL | Freq: Once | INTRAMUSCULAR | Status: AC
  Administered 2016-04-09: 5 mL via INTRADERMAL
  Filled 2016-04-09: qty 5

## 2016-04-09 NOTE — ED Notes (Signed)
Pt is altered at baseline. Son & caregiver departed after speaking with MD, but before this RN could review discharge instructions and obtain a signature.

## 2016-04-09 NOTE — ED Notes (Signed)
Patient transported to CT 

## 2016-04-09 NOTE — ED Triage Notes (Signed)
Pt in from [facility] ([facility]) after unwitnessed fall, sustaining R forehead 4cm avulsion, R elbow and R wrist skin tear. Pt is not on blood thinners, hx of frequent falls, wandering. Pt has hx of dementia, minimal verbalization at baseline, BP 142/60, 62HR, 16RR, 96%RA

## 2016-04-09 NOTE — ED Notes (Signed)
Patient placed on continuous pulse oximetry and blood pressure cuff 

## 2016-04-09 NOTE — ED Provider Notes (Signed)
[facility] DEPT Provider Note   CSN: [id number] Arrival date & time: 04/09/16  1540     History   Chief Complaint Chief Complaint  Patient presents with  . Fall  . Head Injury    HPI Roberto Campbell is a 75 y.o. male.  The history is provided by the EMS personnel and a relative (son [place]). History limited by: dementia, nonverbal.  Fall  The current episode started 1 to 2 hours ago. The problem occurs every several days.  Patient had fall at SNF earlier today while in dining area. Noted laceration to forehead and sent here for further evaluation. Per EMS, patient reported to be acting at baseline since then. Pt nonverbal at baseline. Unclear if this was witnessed or not, have made attempt to reach facility without answer. Son states he was called and notified that the fall occurred in dining room today. He is not on anti-coagulation. Not on anti-platelet.   Past Medical History:  Diagnosis Date  . Bladder cancer ([place])   . CKD (chronic kidney disease)   . Dementia   . HTN (hypertension)     Patient Active Problem List   Diagnosis Date Noted  . Hypotension 07/23/2015  . Prolonged QT interval 07/23/2015  . Sinus bradycardia, chronic 07/23/2015  . Dysphagia 07/23/2015  . Depression 07/23/2015  . Hypovolemia 07/20/2015  . Acute renal failure ([place])   . ARF (acute renal failure) ([place]) 07/12/2015  . Sepsis ([place]) 07/12/2015  . Hypernatremia 07/12/2015  . Dehydration 07/12/2015  . Thrombocytopenia ([place]) 07/12/2015  . Elevated troponin 07/12/2015  . 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 Pressure ulcer 07/12/2015  . HCAP (healthcare-associated pneumonia) 07/12/2015  . Dementia with behavioral disturbance 05/07/2015    Past Surgical History:  Procedure Laterality Date  . BACK SURGERY    . CHOLECYSTECTOMY    . MINOR HEMORRHOIDECTOMY         Home Medications    Prior to Admission medications   Medication Sig Start Date End Date Taking?  Authorizing Provider  AMBULATORY NON FORMULARY MEDICATION Take 1 each by mouth 3 (three) times daily with meals. Supplement: Magic Cup 3 times daily with meals    Historical Provider, MD  amLODipine (NORVASC) 10 MG tablet Take 10 mg by mouth every morning.    Historical Provider, MD  amLODipine (NORVASC) 5 MG tablet Take 1 tablet (5 mg total) by mouth daily. Patient not taking: Reported on 03/12/2016 07/20/15   Eugenie Filler, MD  carbamazepine (TEGRETOL) 100 MG chewable tablet Chew 200 mg by mouth 2 (two) times daily.     Historical Provider, MD  escitalopram (LEXAPRO) 5 MG tablet Take 5 mg by mouth every morning.    Historical Provider, MD  LORazepam (ATIVAN) 0.5 MG tablet Take 0.5 mg by mouth once.    Historical Provider, MD  loxapine (LOXITANE) 10 MG capsule Take 10 mg by mouth at bedtime.    Historical Provider, MD  Melatonin 1 MG CAPS Take 1 mg by mouth at bedtime.    Historical Provider, MD  montelukast (SINGULAIR) 10 MG tablet Take 10 mg by mouth daily with breakfast.     Historical Provider, MD  omeprazole (PRILOSEC) 20 MG capsule Take 20 mg by mouth daily before breakfast.     Historical Provider, MD  QUEtiapine (SEROQUEL) 100 MG tablet Take 1 tablet (100 mg total) by mouth at bedtime. 03/13/16   Patrecia Pour, NP  solifenacin (VESICARE) 5 MG tablet Take 5 mg by mouth  every morning.     Historical Provider, MD  tamsulosin (FLOMAX) 0.4 MG CAPS capsule Take 0.4 mg by mouth daily after breakfast.     Historical Provider, MD    Family History Family History  Problem Relation Age of Onset  . Heart failure Father   . Cancer Father     Prostate  . Heart failure Brother     Social History Social History  Substance Use Topics  . Smoking status: Former Research scientist (life sciences)  . Smokeless tobacco: Never Used  . Alcohol use No     Allergies   Morphine and related; Alprazolam; Atorvastatin; Meperidine; and Ativan [lorazepam]   Review of Systems Review of Systems  Unable to perform ROS:  Dementia     Physical Exam Updated Vital Signs BP 139/81   Pulse (!) 57   Temp 97.6 F (36.4 C) (Oral)   Resp 16   Ht 5\' 11"  (1.803 m)   Wt 95.3 kg   SpO2 96%   BMI 29.29 kg/m   Physical Exam  Constitutional: No distress.  Elderly and frail appearing  HENT:  Head:    Nose: No septal deviation or nasal septal hematoma.  Eyes: Conjunctivae are normal. Pupils are equal, round, and reactive to light.  Neck: Normal range of motion. Neck supple.  Cardiovascular: Normal rate and regular rhythm.   Pulmonary/Chest: Effort normal. No respiratory distress. He has no wheezes.  Abdominal: Soft. He exhibits no distension. There is no tenderness.  Neurological: He is disoriented. GCS eye subscore is 3. GCS verbal subscore is 1. GCS motor subscore is 4.  Skin: He is not diaphoretic. There is erythema (small superficial region to thoracic region, this is known and has bandage placed prior to today).  Psychiatric: He is noncommunicative.  Nursing note and vitals reviewed.    ED Treatments / Results  Labs (all labs ordered are listed, but only abnormal results are displayed) Labs Reviewed - No data to display  EKG  EKG Interpretation None       Radiology Ct Head Wo Contrast  Result Date: 04/09/2016 CLINICAL DATA:  Laceration after fall.  Confusion. EXAM: CT HEAD WITHOUT CONTRAST CT CERVICAL SPINE WITHOUT CONTRAST TECHNIQUE: Multidetector CT imaging of the head and cervical spine was performed following the standard protocol without intravenous contrast. Multiplanar CT image reconstructions of the cervical spine were also generated. COMPARISON:  07/12/2015 CT head, 05/06/2015 CT head FINDINGS: CT HEAD FINDINGS BRAIN: There is sulcal and ventricular prominence consistent with superficial and central atrophy. No intraparenchymal hemorrhage, mass effect nor midline shift. There are patchy periventricular and subcortical white matter hypodensities consistent with chronic small vessel  ischemic disease. No acute large vascular territory infarcts. No abnormal extra-axial fluid collections. Basal cisterns are patent. VASCULAR: Moderate calcific atherosclerosis of the carotid siphons. SKULL: No skull fracture. Mild right periorbital soft tissue swelling with focus of subcutaneous emphysema consistent with laceration. No radiopaque foreign body. Mild left frontoparietal scalp soft tissue swelling. SINUSES/ORBITS: The mastoid air-cells are clear. The included paranasal sinuses are well-aerated.The included ocular globes and orbital contents are non-suspicious. OTHER: None. CT CERVICAL SPINE FINDINGS Study compromised by patient motion despite repeat imaging. Alignment: Slight reversal cervical lordosis at C3-4 likely on the basis of degenerative disc disease and facet arthropathy. Skull base and vertebrae: No acute fracture. Soft tissues and spinal canal: No prevertebral soft tissue swelling. No intraspinal hemorrhage. Disc levels: Multilevel degenerative disc disease consistent cervical spondylosis. No jumped facets. Upper chest: Mild centrilobular emphysema. Other: Negative IMPRESSION: Limited cervical  spine CT due to patient motion. No gross fracture nor destruction. Chronic cerebral atrophy with small vessel ischemic disease. No acute intracranial abnormality. Scalp soft tissue swelling bilaterally overlying the frontoparietal regions with small right-sided scalp laceration. Electronically Signed   By: Ashley Royalty M.D.   On: 04/09/2016 21:48   Ct Cervical Spine Wo Contrast  Result Date: 04/09/2016 CLINICAL DATA:  Laceration after fall.  Confusion. EXAM: CT HEAD WITHOUT CONTRAST CT CERVICAL SPINE WITHOUT CONTRAST TECHNIQUE: Multidetector CT imaging of the head and cervical spine was performed following the standard protocol without intravenous contrast. Multiplanar CT image reconstructions of the cervical spine were also generated. COMPARISON:  07/12/2015 CT head, 05/06/2015 CT head FINDINGS:  CT HEAD FINDINGS BRAIN: There is sulcal and ventricular prominence consistent with superficial and central atrophy. No intraparenchymal hemorrhage, mass effect nor midline shift. There are patchy periventricular and subcortical white matter hypodensities consistent with chronic small vessel ischemic disease. No acute large vascular territory infarcts. No abnormal extra-axial fluid collections. Basal cisterns are patent. VASCULAR: Moderate calcific atherosclerosis of the carotid siphons. SKULL: No skull fracture. Mild right periorbital soft tissue swelling with focus of subcutaneous emphysema consistent with laceration. No radiopaque foreign body. Mild left frontoparietal scalp soft tissue swelling. SINUSES/ORBITS: The mastoid air-cells are clear. The included paranasal sinuses are well-aerated.The included ocular globes and orbital contents are non-suspicious. OTHER: None. CT CERVICAL SPINE FINDINGS Study compromised by patient motion despite repeat imaging. Alignment: Slight reversal cervical lordosis at C3-4 likely on the basis of degenerative disc disease and facet arthropathy. Skull base and vertebrae: No acute fracture. Soft tissues and spinal canal: No prevertebral soft tissue swelling. No intraspinal hemorrhage. Disc levels: Multilevel degenerative disc disease consistent cervical spondylosis. No jumped facets. Upper chest: Mild centrilobular emphysema. Other: Negative IMPRESSION: Limited cervical spine CT due to patient motion. No gross fracture nor destruction. Chronic cerebral atrophy with small vessel ischemic disease. No acute intracranial abnormality. Scalp soft tissue swelling bilaterally overlying the frontoparietal regions with small right-sided scalp laceration. Electronically Signed   By: Ashley Royalty M.D.   On: 04/09/2016 21:48    Procedures .Suture Removal Date/Time: 04/09/2016 10:27 PM Performed by: Theodosia Quay Authorized by: Elnora Morrison   Consent:    Consent obtained:  Verbal    Consent given by: son, present at bedside. Location:    Location:  [place] location:  Forehead Procedure details:    Wound appearance:  No signs of infection   Number of sutures removed:  5 Post-procedure details:    Patient tolerance of procedure:  Tolerated well, no immediate complications   (including critical care time)  Medications Ordered in ED Medications  haloperidol lactate (HALDOL) injection 2 mg (0 mg Intramuscular Hold 04/09/16 1947)  lidocaine-EPINEPHrine-tetracaine (LET) solution (3 mLs Topical Given 04/09/16 1708)  lidocaine (PF) (XYLOCAINE) 1 % injection 5 mL (5 mLs Intradermal Given 04/09/16 1719)  LORazepam (ATIVAN) tablet 1 mg (1 mg Oral Given 04/09/16 1832)  haloperidol (HALDOL) tablet 2 mg (2 mg Oral Given 04/09/16 1937)     Initial Impression / Assessment and Plan / ED Course  I have reviewed the triage vital signs and the nursing notes.  Pertinent labs & imaging results that were available during my care of the patient were reviewed by me and considered in my medical decision making (see chart for details).  Clinical Course     29 M PMH dementia presents s/p fall from facility. Has laceration to forehead. At baseline per EMS and son at bedside.  Head CT and C spine CT obtained after getting haldol to settle his agitation. I reviewed most prior EKGs and felt that QTc was appropriate for a small one time dose. He tolerated this well. No acute findings present on head CT. Wound repair as noted above. Recommended removal in 5 days. Discharged back to [facility] via [facility] in stable condition.  Final Clinical Impressions(s) / ED Diagnoses   Final diagnoses:  Injury of head, initial encounter  Laceration of forehead, initial encounter  Fall at nursing home, initial encounter    New Prescriptions New Prescriptions   No medications on file     Theodosia Quay, MD 04/09/16 2232    Elnora Morrison, MD 04/09/16 [id number]    Elnora Morrison,  MD 04/15/16 2001

## 2017-01-05 ENCOUNTER — Emergency Department (HOSPITAL_COMMUNITY): Payer: Medicare Other

## 2017-01-05 ENCOUNTER — Emergency Department (HOSPITAL_COMMUNITY)
Admission: EM | Admit: 2017-01-05 | Discharge: 2017-01-05 | Disposition: A | Discharge status: Home / Self Care | Payer: Medicare Other | Attending: Emergency Medicine | Admitting: Emergency Medicine

## 2017-01-05 ENCOUNTER — Encounter (HOSPITAL_COMMUNITY): Payer: Self-pay | Admitting: Emergency Medicine

## 2017-01-05 DIAGNOSIS — F0390 Unspecified dementia without behavioral disturbance: Secondary | ICD-10-CM | POA: Insufficient documentation

## 2017-01-05 DIAGNOSIS — J189 Pneumonia, unspecified organism: Secondary | ICD-10-CM

## 2017-01-05 DIAGNOSIS — Z87891 Personal history of nicotine dependence: Secondary | ICD-10-CM | POA: Diagnosis not present

## 2017-01-05 DIAGNOSIS — I129 Hypertensive chronic kidney disease with stage 1 through stage 4 chronic kidney disease, or unspecified chronic kidney disease: Secondary | ICD-10-CM | POA: Diagnosis not present

## 2017-01-05 DIAGNOSIS — N189 Chronic kidney disease, unspecified: Secondary | ICD-10-CM | POA: Diagnosis not present

## 2017-01-05 DIAGNOSIS — Z5320 Procedure and treatment not carried out because of patient's decision for unspecified reasons: Secondary | ICD-10-CM | POA: Insufficient documentation

## 2017-01-05 DIAGNOSIS — Z8551 Personal history of malignant neoplasm of bladder: Secondary | ICD-10-CM | POA: Insufficient documentation

## 2017-01-05 DIAGNOSIS — A419 Sepsis, unspecified organism: Secondary | ICD-10-CM | POA: Diagnosis not present

## 2017-01-05 DIAGNOSIS — Z79899 Other long term (current) drug therapy: Secondary | ICD-10-CM | POA: Insufficient documentation

## 2017-01-05 DIAGNOSIS — R4182 Altered mental status, unspecified: Secondary | ICD-10-CM | POA: Diagnosis present

## 2017-01-05 LAB — COMPREHENSIVE METABOLIC PANEL
ALT: 16 U/L — ABNORMAL LOW (ref 17–63)
AST: 18 U/L (ref 15–41)
Albumin: 3.5 g/dL (ref 3.5–5.0)
Alkaline Phosphatase: 97 U/L (ref 38–126)
Anion gap: 10 (ref 5–15)
BUN: 22 mg/dL — ABNORMAL HIGH (ref 6–20)
CO2: 24 mmol/L (ref 22–32)
Calcium: 8.9 mg/dL (ref 8.9–10.3)
Chloride: 107 mmol/L (ref 101–111)
Creatinine, Ser: 1.06 mg/dL (ref 0.61–1.24)
GFR calc Af Amer: >60 mL/min (ref >60)
GFR calc non Af Amer: >60 mL/min (ref >60)
Glucose, Bld: 137 mg/dL — ABNORMAL HIGH (ref 65–99)
Potassium: 3.9 mmol/L (ref 3.5–5.1)
Sodium: 141 mmol/L (ref 135–145)
Total Bilirubin: 0.5 mg/dL (ref 0.3–1.2)
Total Protein: 7.2 g/dL (ref 6.5–8.1)

## 2017-01-05 LAB — CBC WITH DIFFERENTIAL/PLATELET
Basophils Absolute: 0 10*3/uL (ref 0.0–0.1)
Basophils Relative: 0 %
Eosinophils Absolute: 0.1 10*3/uL (ref 0.0–0.7)
Eosinophils Relative: 1 %
HCT: 40.5 % (ref 39.0–52.0)
Hemoglobin: 13.4 g/dL (ref 13.0–17.0)
Lymphocytes Relative: 9 %
Lymphs Abs: 0.8 10*3/uL (ref 0.7–4.0)
MCH: 31.4 pg (ref 26.0–34.0)
MCHC: 33.1 g/dL (ref 30.0–36.0)
MCV: 94.8 fL (ref 78.0–100.0)
Monocytes Absolute: 0.8 10*3/uL (ref 0.1–1.0)
Monocytes Relative: 9 %
Neutro Abs: 7.1 10*3/uL (ref 1.7–7.7)
Neutrophils Relative %: 81 %
Platelets: 229 10*3/uL (ref 150–400)
RBC: 4.27 MIL/uL (ref 4.22–5.81)
RDW: 14.1 % (ref 11.5–15.5)
WBC: 8.8 10*3/uL (ref 4.0–10.5)

## 2017-01-05 LAB — I-STAT CG4 LACTIC ACID, ED: Lactic Acid, Venous: 2.32 mmol/L (ref 0.5–1.9)

## 2017-01-05 LAB — PROTIME-INR
INR: 1.01
Prothrombin Time: 13.3 seconds (ref 11.4–15.2)

## 2017-01-05 MED ORDER — VANCOMYCIN HCL IN DEXTROSE 1-5 GM/200ML-% IV SOLN: 1000.0000 mg | Freq: Once | INTRAVENOUS | Status: DC

## 2017-01-05 MED ORDER — PIPERACILLIN-TAZOBACTAM 3.375 G IVPB 30 MIN: 3.3750 g | Freq: Once | INTRAVENOUS | Status: DC

## 2017-01-05 NOTE — ED Notes (Signed)
Bed: NW29 Expected date:  Expected time:  Means of arrival:  Comments: 76 yo AMS

## 2017-01-05 NOTE — ED Notes (Signed)
Patient transported to X-ray 

## 2017-01-05 NOTE — ED Notes (Signed)
Attempted calling report to [facility], was transferred multiple times then no answer on last transfer.

## 2017-01-05 NOTE — ED Triage Notes (Signed)
Per [facility] patient from [facility] for acute AMS today and fever.  Patient has swallow precautions so wasn't given any medications for fever at SNF.  Patient vitals: 100.8 oral, 150/80, 70HR, 12R, 84% on room air, came up to 94% on NRB.  CBG 201

## 2017-01-05 NOTE — ED Notes (Signed)
Pts son is at bedside. Pts son does not want to continue care. Antibiotics and fluids held until told otherwise.

## 2017-01-05 NOTE — ED Notes (Signed)
Spoke on phone with Herbert Seta from [facility] and provided update on status. Vangie RN requested Order for no labs/hospitalization for [facility].

## 2017-01-05 NOTE — ED Notes (Signed)
Vangie with [facility] called stating they are needing an order to come back with patient for no treatment.  Made Dr Wilson Singer aware, MD will be filling out MOST form to go back to SNF with patient.

## 2017-01-05 NOTE — ED Notes (Signed)
[facility]AR notified about transfer needed back to [facility]

## 2017-01-05 NOTE — ED Notes (Signed)
Patient's son here at bedside wanting to talk to MD.

## 2017-01-05 NOTE — ED Provider Notes (Signed)
[facility] DEPT Provider Note   CSN: [id number] Arrival date & time: 01/05/17  1144     History   Chief Complaint Chief Complaint  Patient presents with  . Altered Mental Status  . Fever    HPI Roberto Campbell is a 76 y.o. male.  HPI   76 year old male coming from [facility] for change in mental status. Onset sometime today, but I'm not exactly clear. Afebrile with temperature 100.8 and hypoxic on room air with oxygen saturation of 80%. Son arrived to the emergency room after arrival patient. He states that his father currently seems to be more or less his usual state of health. His level of alertness waxes/wanes, but currently not significantly changed from his norma. He Is essentially bedbound and   Past Medical History:  Diagnosis Date  . Bladder cancer ([place])   . CKD (chronic kidney disease)   . Dementia   . HTN (hypertension)     Patient Active Problem List   Diagnosis Date Noted  . Hypotension 07/23/2015  . Prolonged QT interval 07/23/2015  . Sinus bradycardia, chronic 07/23/2015  . Dysphagia 07/23/2015  . Depression 07/23/2015  . Hypovolemia 07/20/2015  . Acute renal failure ([place])   . ARF (acute renal failure) ([place]) 07/12/2015  . Sepsis ([place]) 07/12/2015  . Hypernatremia 07/12/2015  . Dehydration 07/12/2015  . Thrombocytopenia ([place]) 07/12/2015  . Elevated troponin 07/12/2015  . 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 Pressure ulcer 07/12/2015  . HCAP (healthcare-associated pneumonia) 07/12/2015  . Dementia with behavioral disturbance 05/07/2015    Past Surgical History:  Procedure Laterality Date  . BACK SURGERY    . CHOLECYSTECTOMY    . MINOR HEMORRHOIDECTOMY         Home Medications    Prior to Admission medications   Medication Sig Start Date End Date Taking? Authorizing Provider  Amino Acids-Protein Hydrolys (FEEDING SUPPLEMENT, PRO-STAT SUGAR FREE 64,) LIQD Take 30 mLs by mouth 2 (two) times daily with a meal.    Yes [provider]  carbamazepine (TEGRETOL) 200 MG tablet Take 600 mg by mouth 2 (two) times daily. 12/25/16  Yes [provider]  Morphine Sulfate (MORPHINE CONCENTRATE) 10 MG/0.5ML SOLN concentrated solution Take 5 mg by mouth every hour as needed for shortness of breath (and agitation).   Yes [provider]  Nutritional Supplements (NUTRITIONAL DRINK PO) Take 1 Can by mouth 3 (three) times daily. *Magic Cup*   Yes [provider]  amLODipine (NORVASC) 5 MG tablet Take 1 tablet (5 mg total) by mouth daily. Patient not taking: Reported on 03/12/2016 07/20/15   Eugenie Filler, MD    Family History Family History  Problem Relation Age of Onset  . Heart failure Father   . Cancer Father        Prostate  . Heart failure Brother     Social History Social History  Substance Use Topics  . Smoking status: Former Research scientist (life sciences)  . Smokeless tobacco: Never Used  . Alcohol use No     Allergies   Morphine and related; Alprazolam; Atorvastatin; Meperidine; and Ativan [lorazepam]   Review of Systems Review of Systems  Level 5 caveat because nonverbal.   Physical Exam Updated Vital Signs BP 121/68 (BP Location: Right Arm)   Pulse 61   Resp 15   Ht 6\' 2"  (1.88 m)   Wt 75.9 kg (167 lb 6 oz)   SpO2 91%   BMI 21.49 kg/m   Physical Exam  Constitutional:  Chronically ill appearing. Laying on R side in bed.   HENT:  Head: Normocephalic and atraumatic.  Eyes: Conjunctivae are normal. Right eye exhibits no discharge. Left eye exhibits no discharge.  Neck: Neck supple.  Cardiovascular: Normal rate, regular rhythm and normal heart sounds.  Exam reveals no gallop and no friction rub.   No murmur heard. Pulmonary/Chest:  Mild tachypnea. Coarse breath sounds b/l.   Abdominal: Soft. He exhibits no distension. There is no tenderness.  Musculoskeletal: He exhibits no edema or tenderness.  Neurological:  Drowsy. Will open eyes but not following commands.     Skin: Skin is warm and dry.  Psychiatric: He has a normal mood and affect. His behavior is normal. Thought content normal.  Nursing note and vitals reviewed.    ED Treatments / Results  Labs (all labs ordered are listed, but only abnormal results are displayed) Labs Reviewed  COMPREHENSIVE METABOLIC PANEL - Abnormal; Notable for the following:       Result Value   Glucose, Bld 137 (*)    BUN 22 (*)    ALT 16 (*)    All other components within normal limits  I-STAT CG4 LACTIC ACID, ED - Abnormal; Notable for the following:    Lactic Acid, Venous 2.32 (*)    All other components within normal limits  CULTURE, BLOOD (ROUTINE X 2)  CULTURE, BLOOD (ROUTINE X 2)  CBC WITH DIFFERENTIAL/PLATELET  PROTIME-INR    EKG  EKG Interpretation  Date/Time:  Saturday January 05 2017 12:04:39 EDT Ventricular Rate:  64 PR Interval:    QRS Duration: 110 QT Interval:  413 QTC Calculation: 427 R Axis:   96 Text Interpretation:  Sinus rhythm Prolonged PR interval Right axis deviation Rate is slower Confirmed by Shanon Rosser [id number]) on 01/06/2017 2:24:20 PM       Radiology Dg Chest 2 View  Result Date: 01/05/2017 CLINICAL DATA:  Cough and fevers EXAM: CHEST  2 VIEW COMPARISON:  01/18/2016 FINDINGS: Elevation of left hemidiaphragm is noted. Left-sided infiltrate is noted new from the prior exam with associated effusion. The right lung remains clear. Cardiac shadow is stable. Aortic calcifications are seen. IMPRESSION: Elevation of left hemidiaphragm with new left-sided infiltrate and effusion. Electronically Signed   By: Inez Catalina M.D.   On: 01/05/2017 12:44    Procedures Procedures (including critical care time)  Medications Ordered in ED Medications - No data to display   Initial Impression / Assessment and Plan / ED Course  I have reviewed the triage vital signs and the nursing notes.  Pertinent labs & imaging results that were available during my care of the patient were  reviewed by me and considered in my medical decision making (see chart for details).     76yM with sepsis/pneumonia. Son at bedside. Declines care. Not just DNR or hospitalization but all interventions including antibiotics. Understands that this will likely be terminal event if not treated.   2:32 PM MOST form completed by myself and to accompany patient back to [facility].   Final Clinical Impressions(s) / ED Diagnoses   Final diagnoses:  HCAP (healthcare-associated pneumonia)  Sepsis, due to unspecified organism [facility])    New Prescriptions New Prescriptions   No medications on file     Virgel Manifold, MD 01/08/17 1424

## 2017-01-05 NOTE — Progress Notes (Signed)
CSW spoke with patients family briefly regarding discharge plans. Family are pleased with [facility] at this time. Patient was received hospice services previously in March however stopped and is now receiving care through their Palliative Care team. Patients family states they will speak with [facility]/ Palliative Care Team regarding wanting patient to receive hospice services again. Family is not seeking residential hospice at this time and family are happy with [facility]. CSW updated RN and EDP.   Kingsley Spittle, [facility] Emergency Room Clinical Social Worker [phone]

## 2017-01-05 NOTE — ED Notes (Signed)
Called and spoke with social worker about patient getting set up with Hospice due to family not wanting any treatment for sepsis/PNA and to be transferred back to [facility].

## 2017-01-05 NOTE — ED Notes (Signed)
Patient's family given photo copy of MOST form filled out by Dr Wilson Singer.   Family denies needing anything at this time when offered something to drink/eat.

## 2017-01-05 NOTE — ED Notes (Signed)
Abnormal lab result given to MD

## 2017-01-05 NOTE — ED Notes (Signed)
ED Provider at bedside. 

## 2017-01-05 NOTE — Discharge Instructions (Signed)
Roberto Campbell has sepsis/pneumonia. His son requests no medical care and he is being discharged back per family wishes. He understands that this will likely be a terminal event but this is what he thinks his father would want.

## 2017-01-05 NOTE — ED Notes (Signed)
Patient's son, POA, requesting to take MOST form on to SNF since director will be there until 5pm today.  Informed son that [facility] will want/need the legal copy of it while they are transporting patient.  Son understood and states that he will take his photocopy by there to SNF and wait for patient there.

## 2017-01-10 LAB — CULTURE, BLOOD (ROUTINE X 2)
Culture: NO GROWTH
Culture: NO GROWTH
Special Requests: ADEQUATE
Special Requests: ADEQUATE

## 2017-05-17 DEATH — deceased

## 2018-01-07 IMAGING — DX DG CHEST 1V PORT
1 series · 1 of 1 positions shown · non-contrast
Comparison: 05/07/2015

CLINICAL DATA: Sepsis

EXAM:
PORTABLE CHEST 1 VIEW

[chest ap]
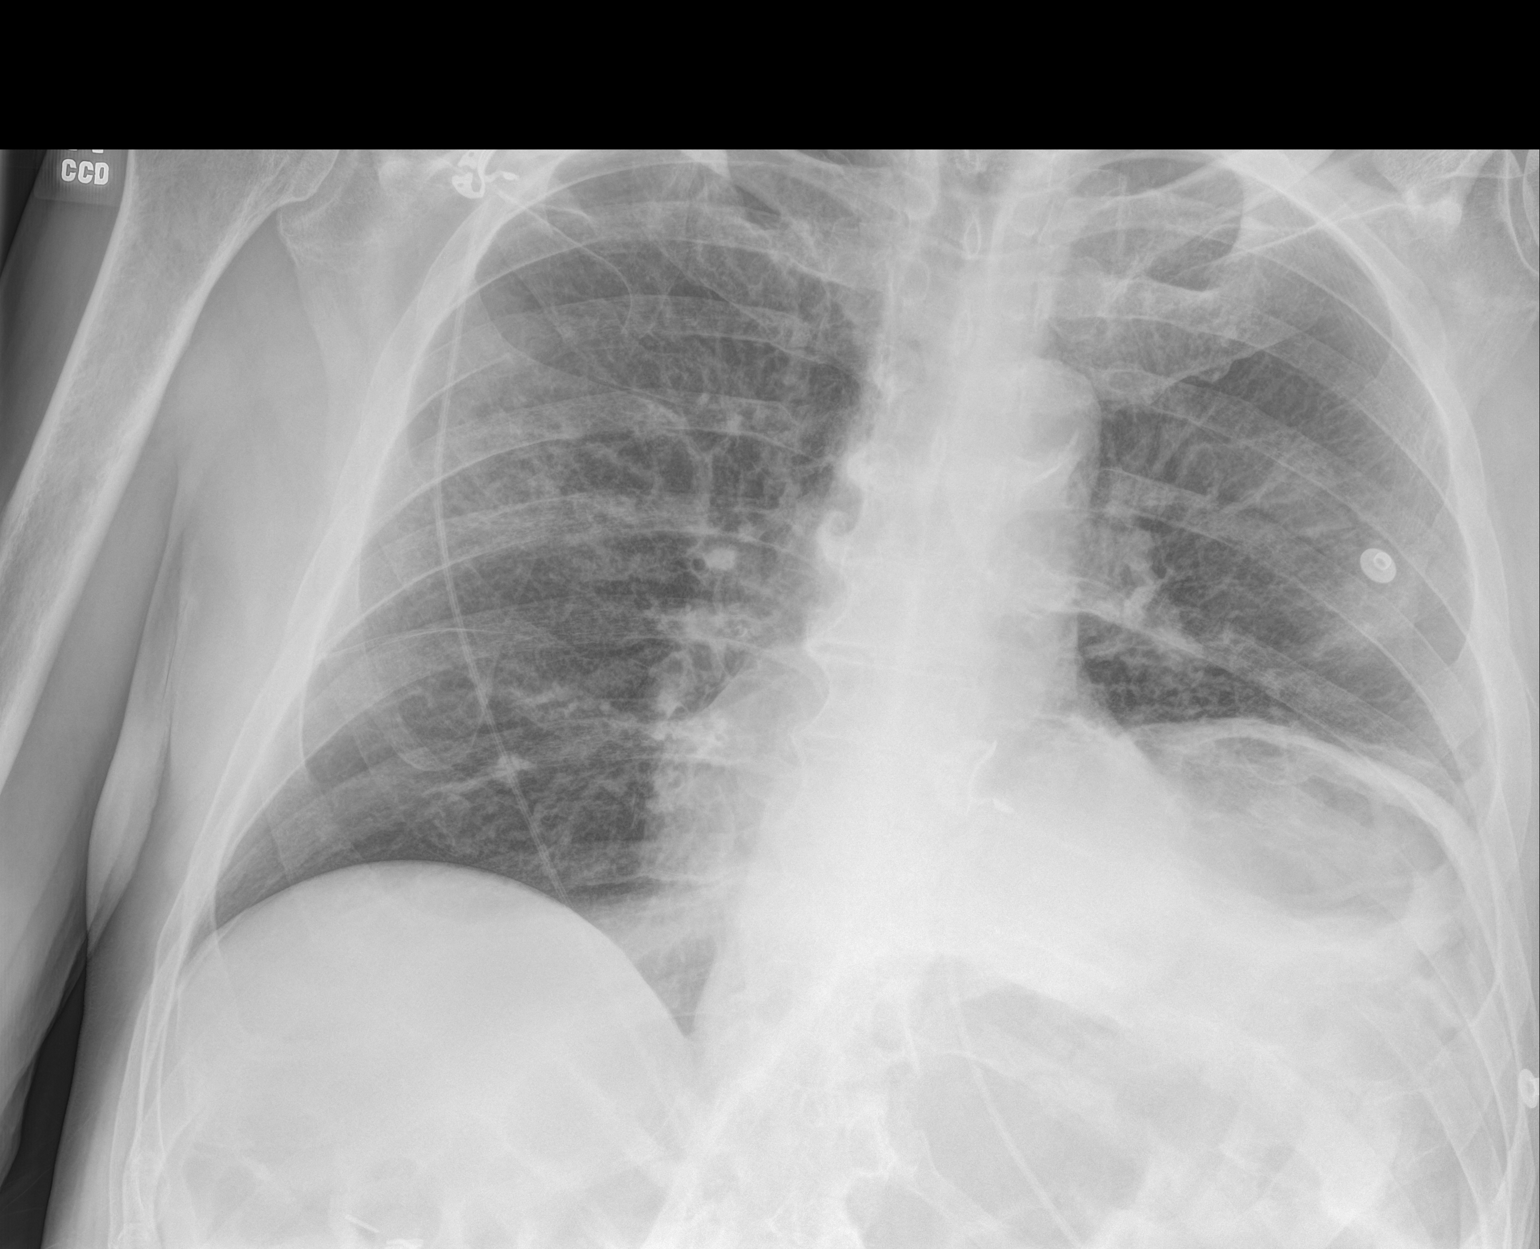

[1 of 1 positions shown; findings below may reference images not displayed]

FINDINGS: Cardiac shadow is within normal limits. Elevation of left
hemidiaphragm is noted and stable. Some mild increased density is
noted in the right upper lobe which may represent early infiltrate.
No bony abnormality is seen.
IMPRESSION: Likely early infiltrate in the right upper lobe.

## 2018-01-07 IMAGING — CT CT HEAD W/O CM
2 series · 17 of 30 positions shown, 20 images · non-contrast
Comparison: 05/06/2015

CLINICAL DATA: Dementia, bladder cancer, altered mental status

EXAM:
CT HEAD WITHOUT CONTRAST
TECHNIQUE: Contiguous axial images were obtained from the base of the skull
through the vertex without intravenous contrast.

[Series 2: head w/o · axial · non-contrast · 0.50mm/px · z∈[-53,+67]mm · 9 of 32 slices shown, 12 images]
[im 4/32  brain]
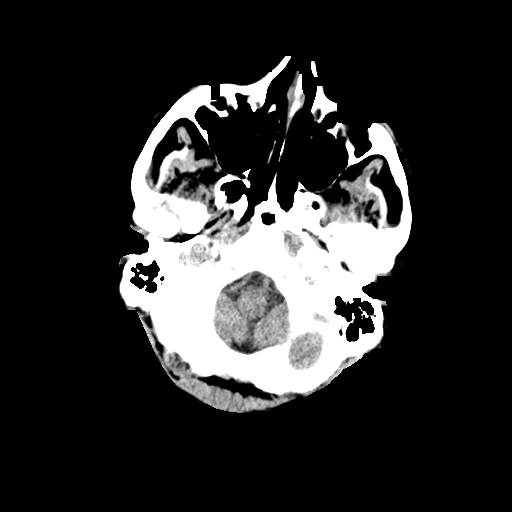
[im 4/32  bone]
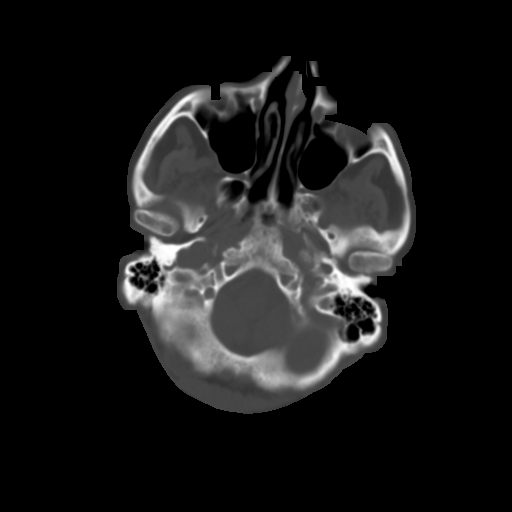
[im 7/32  brain]
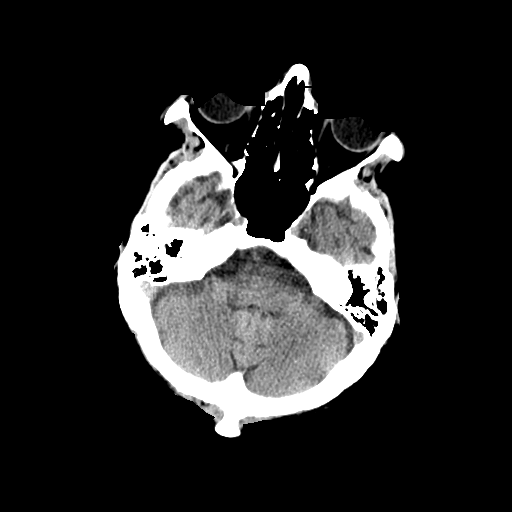
[im 10/32  brain]
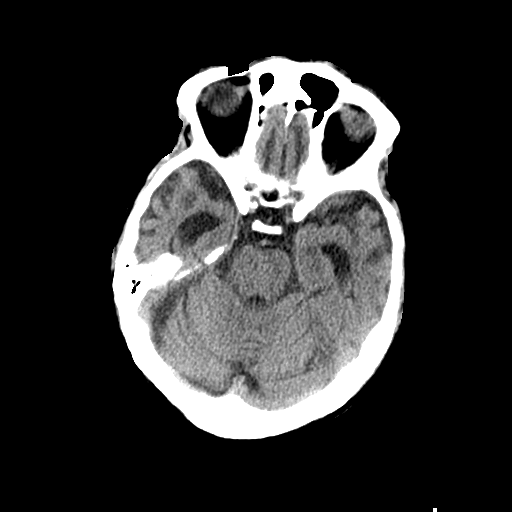
[im 13/32  brain]
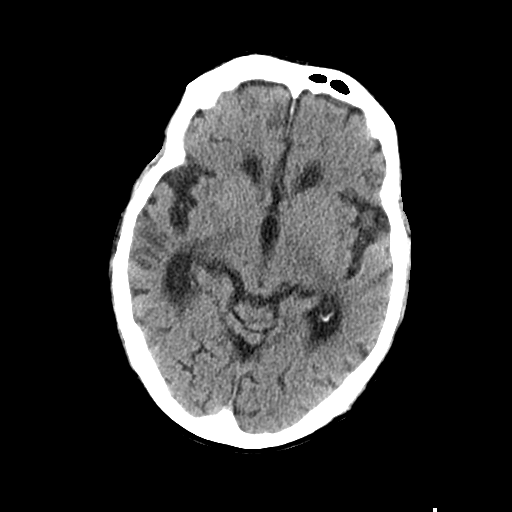
[im 16/32  brain]
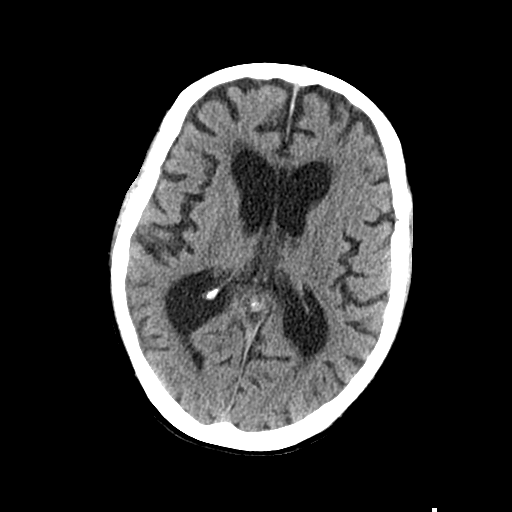
[im 16/32  bone]
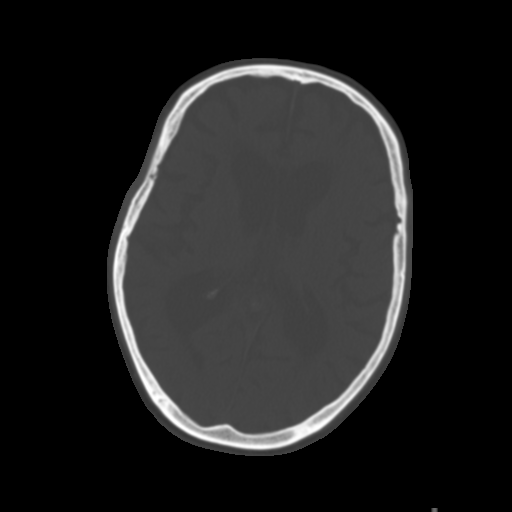
[im 19/32  brain]
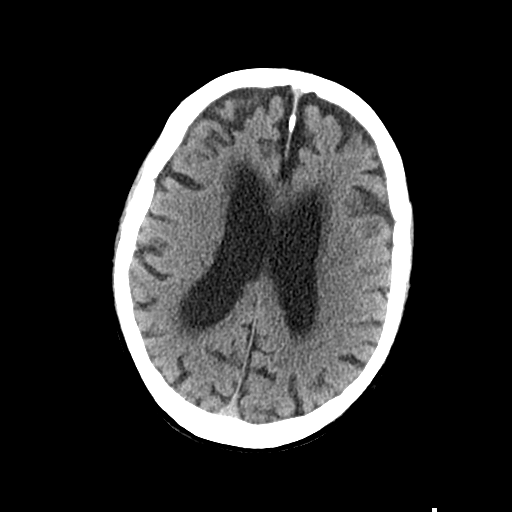
[im 22/32  brain]
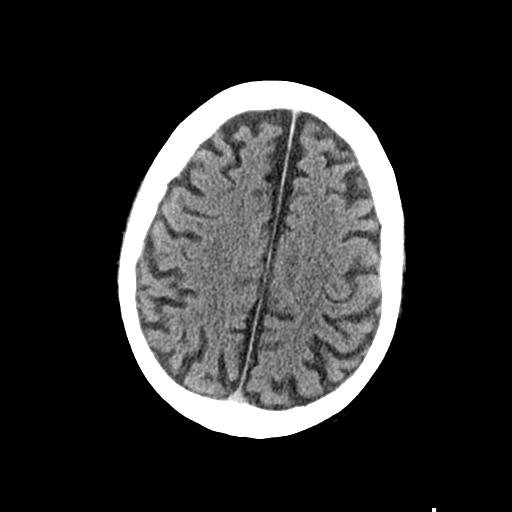
[im 25/32  brain]
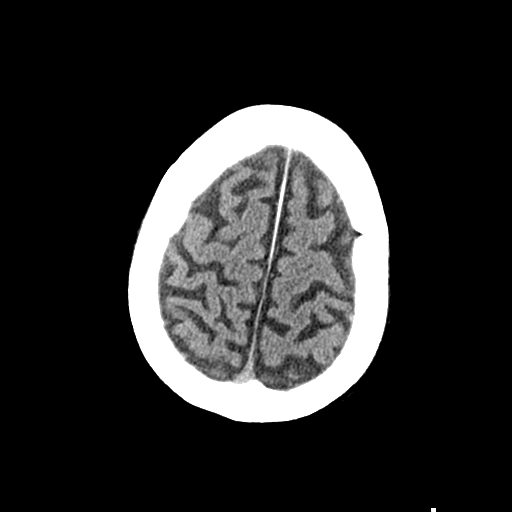
[im 28/32  brain]
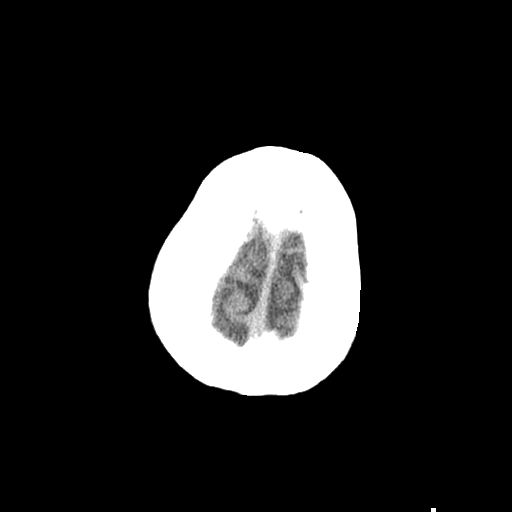
[im 28/32  bone]
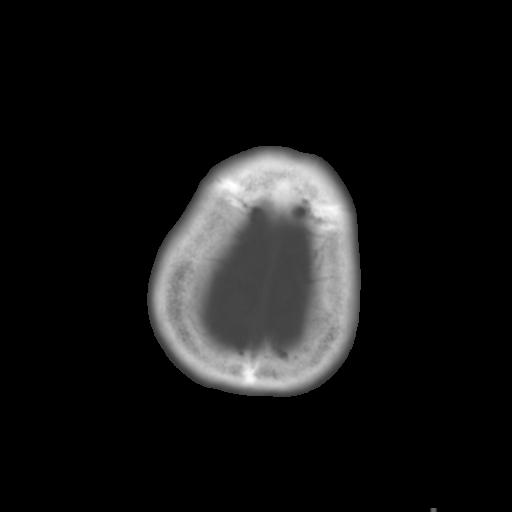

[Series 3: bone windows · axial · 0.50mm/px · z∈[-53,+73]mm · 8 of 54 slices shown]
[im 6/54  bone]
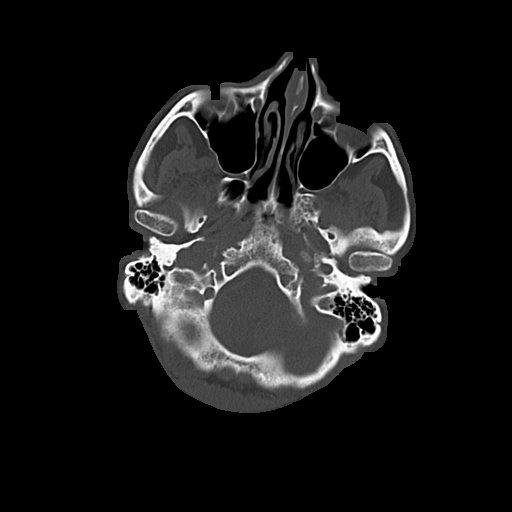
[im 12/54  bone]
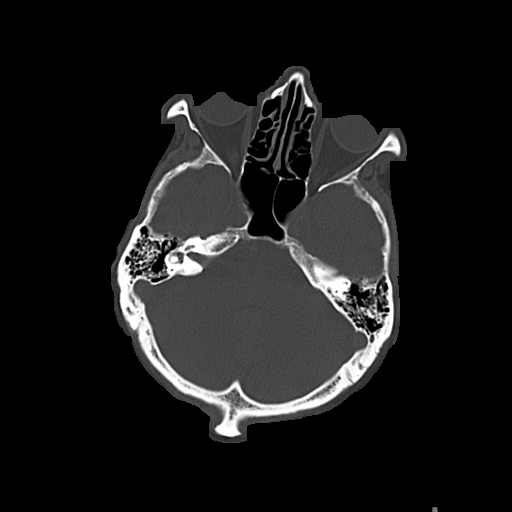
[im 18/54  bone]
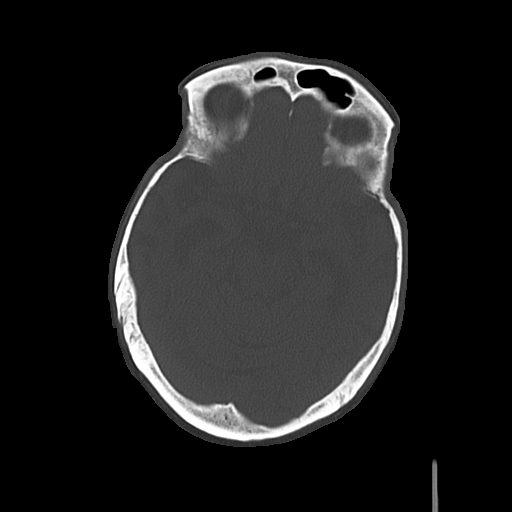
[im 24/54  bone]
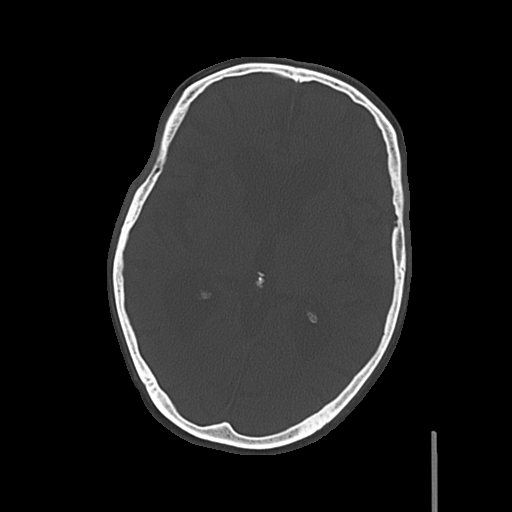
[im 30/54  bone]
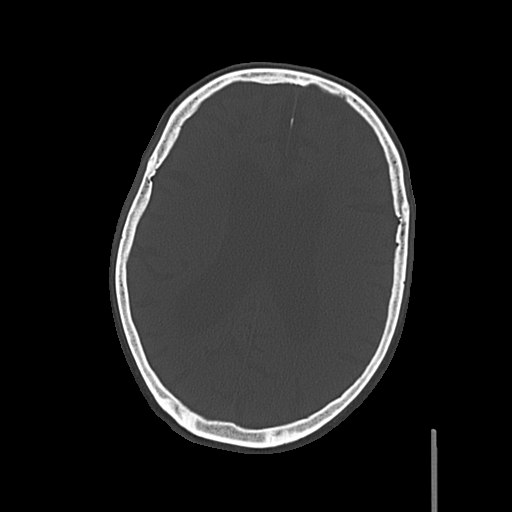
[im 36/54  bone]
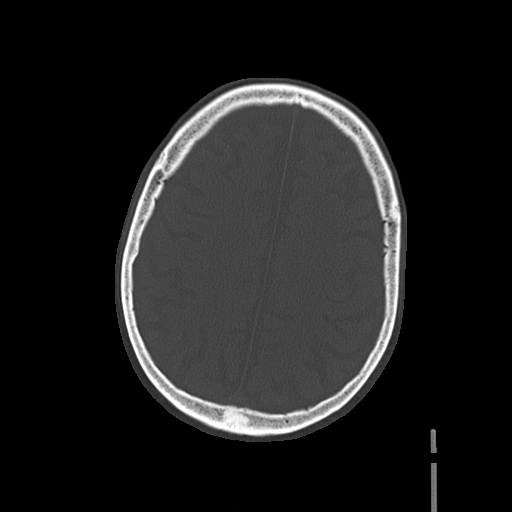
[im 42/54  bone]
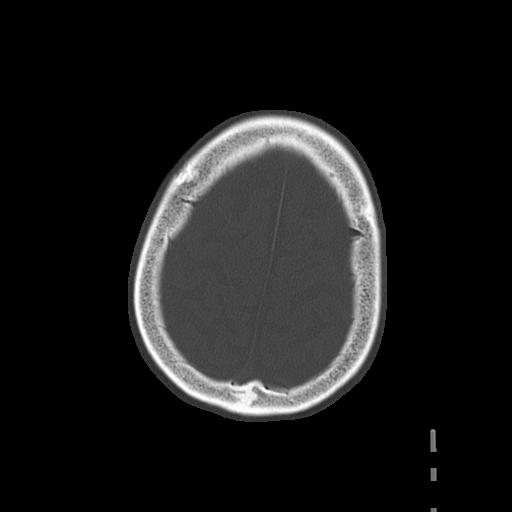
[im 48/54  bone]
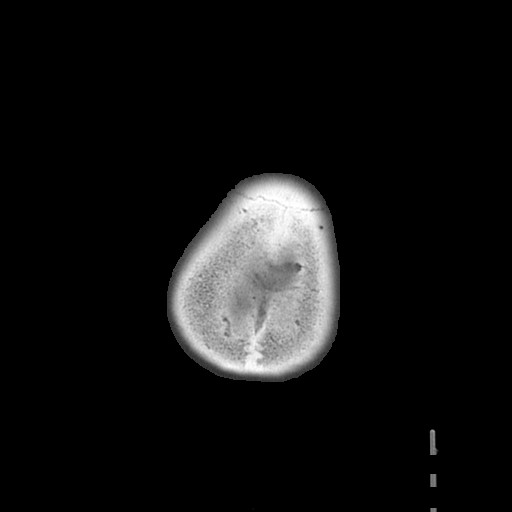

[17 of 30 positions shown; findings below may reference images not displayed]

FINDINGS: Brain: No intracranial hemorrhage, mass effect or midline shift. No
acute cortical infarction. No mass lesion is noted on this
unenhanced scan. Moderate cerebral atrophy again noted. Stable
chronic white matter disease.

Vascular: Atherosclerotic calcifications of carotid siphon.

Skull: Negative for fracture or focal lesion.

Sinuses/Orbits: No acute findings.

Other: None.
IMPRESSION: No acute intracranial abnormality. Moderate cerebral atrophy again
noted. Stable periventricular and patchy subcortical chronic white
matter disease. No definite acute cortical infarction. Ventricular
size is stable from prior exam.

## 2018-01-10 IMAGING — DX DG CHEST 1V PORT
1 series · 2 of 2 positions shown · non-contrast
Comparison: 07/12/2015 .

CLINICAL DATA: Pneumonia.

EXAM:
PORTABLE CHEST 1 VIEW

[Series 1: chest ap · 0.14mm/px · 2 of 2 slices shown]
[im 1/2]
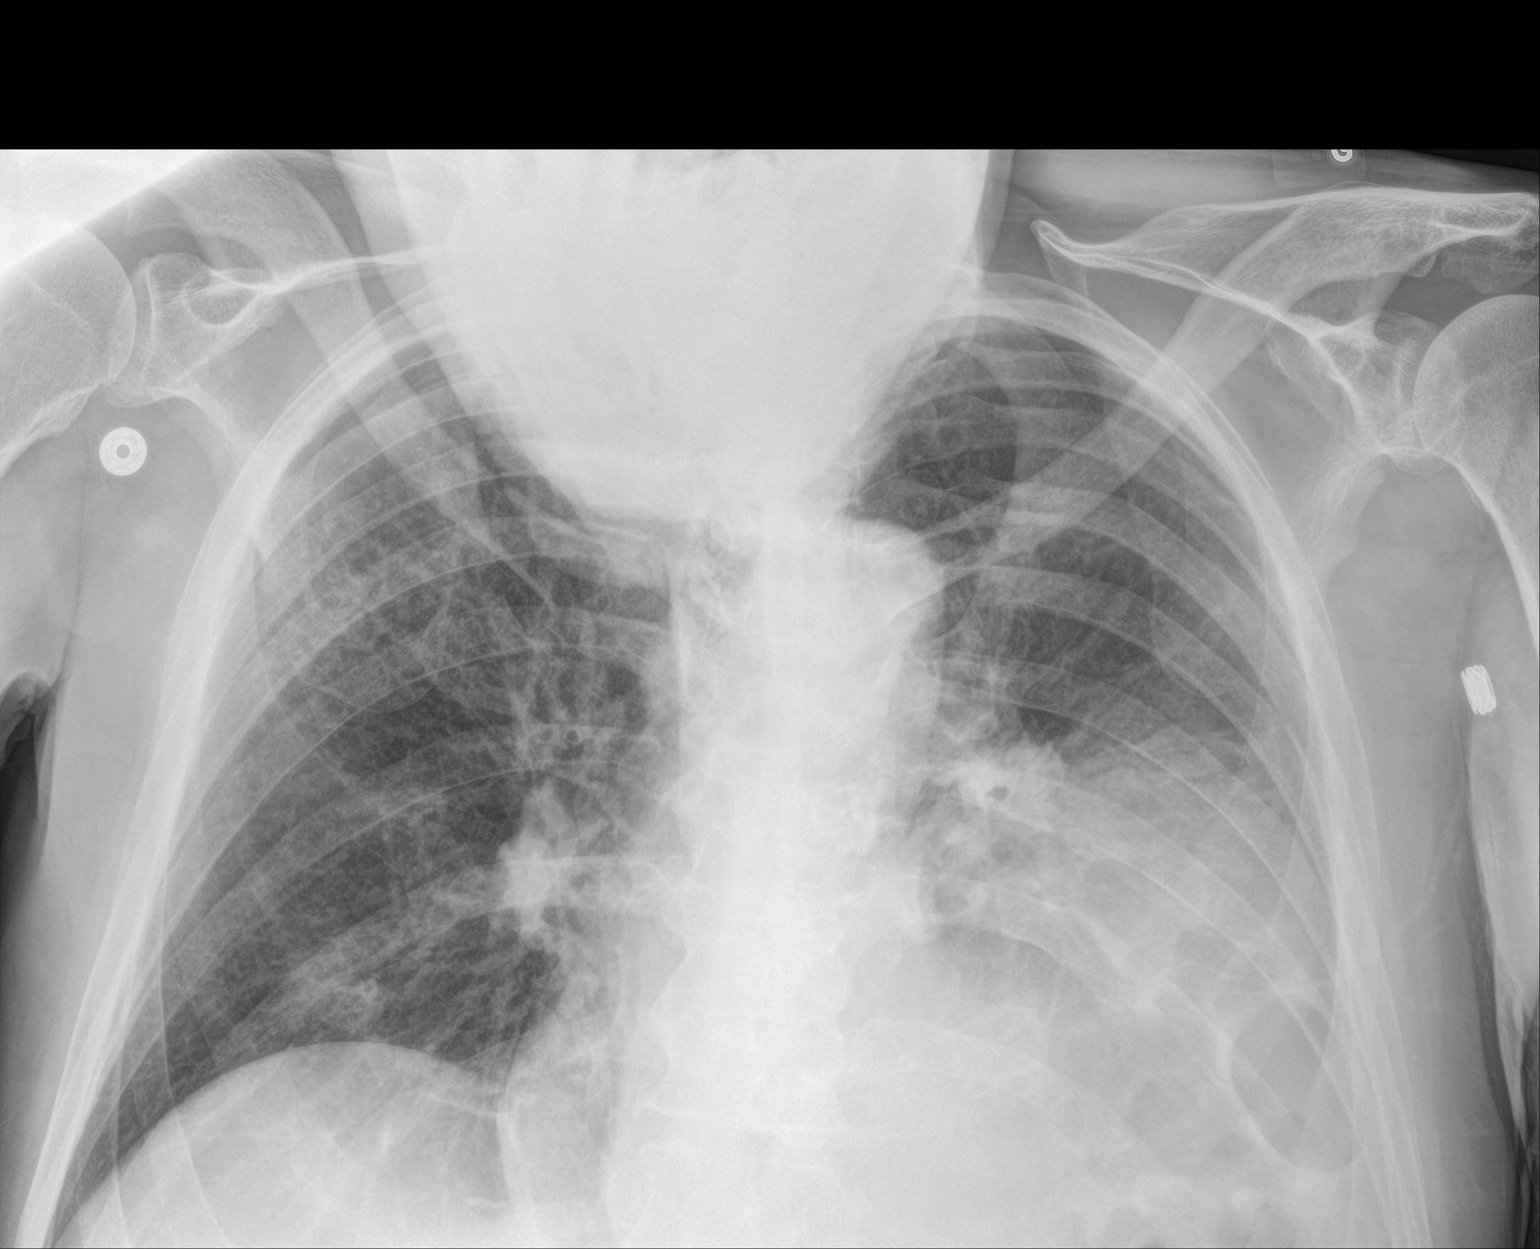
[im 2/2]
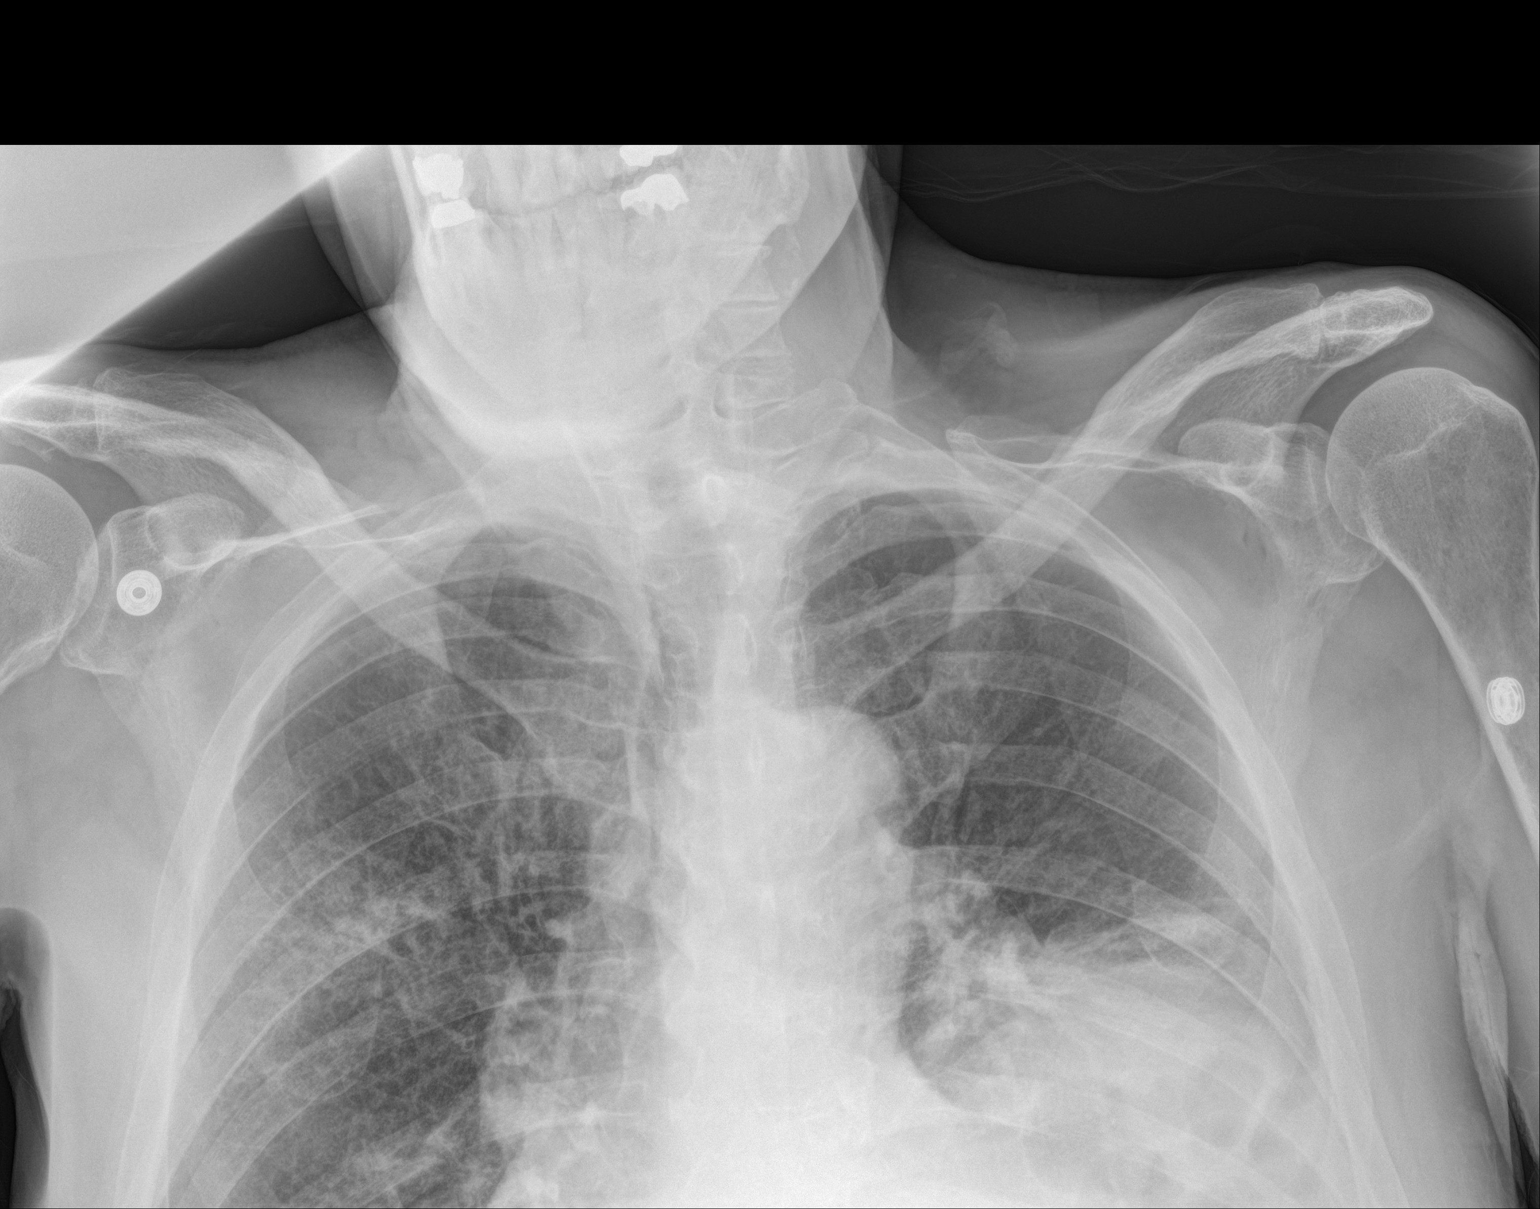

[2 of 2 positions shown; findings below may reference images not displayed]

FINDINGS: Mediastinum and hilar structures normal. Left lower lobe infiltrate
noted consistent pneumonia. Mild right upper lobe and right base
infiltrate. These findings are new. Small left pleural effusion
cannot be excluded. No pneumothorax. Interposition of bowel under
the left hemidiaphragm again noted .
IMPRESSION: Prominent left lower lobe infiltrate. Mild right upper lobe and
right lower lobe infiltrate. These findings are new from prior exam
. Aspiration cannot be excluded. Small left pleural effusion cannot
be excluded.
